# Patient Record
Sex: Female | Born: 1965 | Race: Black or African American | Hispanic: No | Marital: Single | Smoking: Never smoker
Health system: Southern US, Community
[De-identification: ages and names within clinical notes are randomized; demographics above are authoritative.]

## PROBLEM LIST (undated history)

## (undated) DIAGNOSIS — D649 Anemia, unspecified: Secondary | ICD-10-CM

## (undated) HISTORY — PX: DILATION AND CURETTAGE OF UTERUS: SHX78

## (undated) HISTORY — PX: OTHER SURGICAL HISTORY: SHX169

## (undated) HISTORY — DX: Anemia, unspecified: D64.9

---
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
## 1988-09-23 HISTORY — PX: BREAST CYST ASPIRATION: SHX578

## 2005-04-30 ENCOUNTER — Ambulatory Visit: Payer: Self-pay

## 2005-05-13 ENCOUNTER — Ambulatory Visit: Payer: Self-pay | Admitting: Family Medicine

## 2005-08-01 ENCOUNTER — Encounter: Admission: RE | Admit: 2005-08-01 | Discharge: 2005-08-01 | Payer: Self-pay | Admitting: Obstetrics and Gynecology

## 2005-09-19 ENCOUNTER — Ambulatory Visit: Payer: Self-pay

## 2009-06-26 ENCOUNTER — Ambulatory Visit: Payer: Self-pay | Admitting: Obstetrics and Gynecology

## 2009-09-23 HISTORY — PX: FOOT SURGERY: SHX648

## 2010-02-02 ENCOUNTER — Ambulatory Visit: Payer: Self-pay | Admitting: Gastroenterology

## 2010-03-08 ENCOUNTER — Ambulatory Visit: Payer: Self-pay | Admitting: Family Medicine

## 2010-08-14 ENCOUNTER — Ambulatory Visit: Payer: Self-pay | Admitting: Obstetrics and Gynecology

## 2011-11-14 ENCOUNTER — Ambulatory Visit: Payer: Self-pay | Admitting: Obstetrics and Gynecology

## 2013-01-26 ENCOUNTER — Ambulatory Visit: Payer: Self-pay | Admitting: Obstetrics and Gynecology

## 2014-03-22 ENCOUNTER — Ambulatory Visit: Payer: Self-pay | Admitting: Obstetrics and Gynecology

## 2014-07-25 ENCOUNTER — Ambulatory Visit: Payer: Self-pay | Admitting: Family Medicine

## 2014-09-27 ENCOUNTER — Ambulatory Visit: Payer: Self-pay | Admitting: Internal Medicine

## 2014-10-24 ENCOUNTER — Ambulatory Visit: Payer: Self-pay | Admitting: Internal Medicine

## 2014-12-07 ENCOUNTER — Ambulatory Visit: Payer: Self-pay | Admitting: Family Medicine

## 2015-02-03 ENCOUNTER — Ambulatory Visit
Admission: RE | Admit: 2015-02-03 | Discharge: 2015-02-03 | Disposition: A | Discharge status: Home / Self Care | Payer: 59 | Source: Ambulatory Visit | Attending: Family Medicine | Admitting: Family Medicine

## 2015-02-03 ENCOUNTER — Other Ambulatory Visit: Payer: Self-pay | Admitting: Family Medicine

## 2015-02-03 DIAGNOSIS — M545 Low back pain: Secondary | ICD-10-CM | POA: Insufficient documentation

## 2015-04-28 ENCOUNTER — Other Ambulatory Visit: Payer: Self-pay | Admitting: Obstetrics and Gynecology

## 2015-04-28 DIAGNOSIS — Z1231 Encounter for screening mammogram for malignant neoplasm of breast: Secondary | ICD-10-CM

## 2015-05-02 ENCOUNTER — Encounter (HOSPITAL_COMMUNITY): Payer: Self-pay | Admitting: *Deleted

## 2015-05-04 ENCOUNTER — Ambulatory Visit
Admission: RE | Admit: 2015-05-04 | Discharge: 2015-05-04 | Disposition: A | Discharge status: Home / Self Care | Payer: 59 | Source: Ambulatory Visit | Attending: Obstetrics and Gynecology | Admitting: Obstetrics and Gynecology

## 2015-05-04 DIAGNOSIS — Z1231 Encounter for screening mammogram for malignant neoplasm of breast: Secondary | ICD-10-CM | POA: Diagnosis not present

## 2015-10-24 ENCOUNTER — Ambulatory Visit: Payer: Self-pay | Admitting: Family Medicine

## 2015-10-26 ENCOUNTER — Encounter: Payer: Self-pay | Admitting: Family Medicine

## 2015-10-26 ENCOUNTER — Ambulatory Visit (INDEPENDENT_AMBULATORY_CARE_PROVIDER_SITE_OTHER): Payer: 59 | Admitting: Family Medicine

## 2015-10-26 VITALS — BP 108/63 | HR 76 | Temp 98.1°F | Resp 18 | Ht 62.0 in | Wt 199.7 lb

## 2015-10-26 DIAGNOSIS — M25571 Pain in right ankle and joints of right foot: Secondary | ICD-10-CM | POA: Diagnosis not present

## 2015-10-26 DIAGNOSIS — R42 Dizziness and giddiness: Secondary | ICD-10-CM | POA: Diagnosis not present

## 2015-10-26 NOTE — Progress Notes (Signed)
Name: Kristine Mcconnell   MRN: [id number]    DOB: September 05, 1966   Date:10/26/2015       Progress Note  Subjective  Chief Complaint  Chief Complaint  Patient presents with  . Acute Visit    Light Headed    Dizziness This is a new problem. Episode onset: 1 month ago. Pertinent negatives include no abdominal pain, chest pain, chills, coughing, fever, headaches, neck pain, sore throat, vertigo or visual change. She has tried nothing for the symptoms.  reports no vertigo but feels as if she is lightheaded, has not lost her balance, has happened intermittently over the past 4 weeks.  Patient also concerned about pain in her right ankle,history of trauma.  Past Medical History  Diagnosis Date  . Anemia     Past Surgical History  Procedure Laterality Date  . Breast cyst aspiration Left 1990    benign  . Foot surgery Right 2011  . Tonsillectomy  1977    Family History  Problem Relation Age of Onset  . Diabetes Mother   . Hypertension Mother   . Hypertension Father     Social History   Social History  . Marital Status: Single    Spouse Name: N/A  . Number of Children: N/A  . Years of Education: N/A   Occupational History  . Not on file.   Social History Main Topics  . Smoking status: Never Smoker   . Smokeless tobacco: Never Used  . Alcohol Use: No  . Drug Use: No  . Sexual Activity:    Partners: Male   Other Topics Concern  . Not on file   Social History Narrative    No current outpatient prescriptions on file.  No Known Allergies   Review of Systems  Constitutional: Positive for malaise/fatigue. Negative for fever and chills.  HENT: Negative for ear pain and sore throat.   Eyes: Negative for blurred vision and double vision.  Respiratory: Negative for cough and shortness of breath.   Cardiovascular: Negative for chest pain and palpitations.  Gastrointestinal: Negative for abdominal pain, blood in stool and melena.  Genitourinary: Negative for dysuria and  hematuria.  Musculoskeletal: Negative for back pain and neck pain.  Neurological: Positive for dizziness. Negative for vertigo and headaches.  Psychiatric/Behavioral: Negative for depression. The patient is not nervous/anxious and does not have insomnia.     Objective  Filed Vitals:   10/26/15 1151  BP: 108/63  Pulse: 76  Temp: 98.1 F (36.7 C)  TempSrc: Oral  Resp: 18  Height: 5\' 2"  (1.575 m)  Weight: 199 lb 11.2 oz (90.583 kg)  SpO2: 99%    Physical Exam  Constitutional: She is oriented to person, place, and time and well-developed, well-nourished, and in no distress.  HENT:  Head: Normocephalic and atraumatic.  Eyes: Pupils are equal, round, and reactive to light.  Neck: Normal range of motion. Neck supple.  Cardiovascular: Normal rate and regular rhythm.   No murmur heard. Pulmonary/Chest: Effort normal and breath sounds normal. She has no wheezes.  Abdominal: Soft. Bowel sounds are normal. There is no tenderness.  Musculoskeletal: She exhibits no edema.       Right ankle: She exhibits normal range of motion and no swelling. Tenderness.       Feet:  Mild tenderness to palpation over the right postero-lateral ankle joint.  Neurological: She is alert and oriented to person, place, and time. She has normal sensation. No cranial nerve deficit. Gait normal.  Skin: Skin is warm  and dry.  Nursing note and vitals reviewed.     Assessment & Plan  1. Light headedness Unclear etiology. Cardiovascular and neurological exam is normal. We'll start with laboratory evaluation. If lab work is normal but symptoms persist, we'll proceed to the imaging stage. Patient verbalized agreement with plan. - CBC with Differential - Comprehensive Metabolic Panel (CMET) - TSH  2. Right ankle pain  - DG Ankle Complete Right; Future  Bari Handshoe Asad A[facility] 10/26/2015 12:22 PM

## 2015-10-27 LAB — CBC WITH DIFFERENTIAL/PLATELET
BASOS: 1 %
Basophils Absolute: 0 10*3/uL (ref 0.0–0.2)
EOS (ABSOLUTE): 0.1 10*3/uL (ref 0.0–0.4)
Eos: 3 %
HEMOGLOBIN: 11.1 g/dL (ref 11.1–15.9)
Hematocrit: 36.1 % (ref 34.0–46.6)
Immature Grans (Abs): 0 10*3/uL (ref 0.0–0.1)
Immature Granulocytes: 0 %
Lymphocytes Absolute: 1.7 10*3/uL (ref 0.7–3.1)
Lymphs: 38 %
MCH: 26.2 pg — AB (ref 26.6–33.0)
MCHC: 30.7 g/dL — ABNORMAL LOW (ref 31.5–35.7)
MCV: 85 fL (ref 79–97)
Monocytes Absolute: 0.5 10*3/uL (ref 0.1–0.9)
Monocytes: 12 %
NEUTROS ABS: 2 10*3/uL (ref 1.4–7.0)
Neutrophils: 46 %
Platelets: 338 10*3/uL (ref 150–379)
RBC: 4.23 x10E6/uL (ref 3.77–5.28)
RDW: 15 % (ref 12.3–15.4)
WBC: 4.4 10*3/uL (ref 3.4–10.8)

## 2015-10-27 LAB — COMPREHENSIVE METABOLIC PANEL
ALBUMIN: 4.1 g/dL (ref 3.5–5.5)
ALT: 10 IU/L (ref 0–32)
AST: 9 IU/L (ref 0–40)
Albumin/Globulin Ratio: 1.6 (ref 1.1–2.5)
Alkaline Phosphatase: 76 IU/L (ref 39–117)
BUN/Creatinine Ratio: 14 (ref 9–23)
BUN: 11 mg/dL (ref 6–24)
Bilirubin Total: 0.7 mg/dL (ref 0.0–1.2)
CO2: 22 mmol/L (ref 18–29)
Calcium: 9.3 mg/dL (ref 8.7–10.2)
Chloride: 104 mmol/L (ref 96–106)
Creatinine, Ser: 0.76 mg/dL (ref 0.57–1.00)
GFR calc Af Amer: 108 mL/min/{1.73_m2} (ref >59)
GFR calc non Af Amer: 94 mL/min/{1.73_m2} (ref >59)
GLUCOSE: 90 mg/dL (ref 65–99)
Globulin, Total: 2.6 g/dL (ref 1.5–4.5)
Potassium: 5.5 mmol/L — ABNORMAL HIGH (ref 3.5–5.2)
SODIUM: 141 mmol/L (ref 134–144)
Total Protein: 6.7 g/dL (ref 6.0–8.5)

## 2015-10-27 LAB — TSH: TSH: 2.77 u[IU]/mL (ref 0.450–4.500)

## 2015-11-09 ENCOUNTER — Ambulatory Visit: Payer: 59 | Admitting: Family Medicine

## 2016-02-27 ENCOUNTER — Other Ambulatory Visit: Payer: Self-pay | Admitting: Obstetrics and Gynecology

## 2016-02-27 DIAGNOSIS — Z1231 Encounter for screening mammogram for malignant neoplasm of breast: Secondary | ICD-10-CM

## 2016-05-06 ENCOUNTER — Other Ambulatory Visit: Payer: Self-pay | Admitting: Obstetrics and Gynecology

## 2016-05-06 ENCOUNTER — Ambulatory Visit
Admission: RE | Admit: 2016-05-06 | Discharge: 2016-05-06 | Disposition: A | Discharge status: Home / Self Care | Payer: 59 | Source: Ambulatory Visit | Attending: Obstetrics and Gynecology | Admitting: Obstetrics and Gynecology

## 2016-05-06 DIAGNOSIS — Z1231 Encounter for screening mammogram for malignant neoplasm of breast: Secondary | ICD-10-CM | POA: Diagnosis present

## 2016-06-10 ENCOUNTER — Ambulatory Visit (INDEPENDENT_AMBULATORY_CARE_PROVIDER_SITE_OTHER): Payer: 59 | Admitting: Family Medicine

## 2016-06-10 ENCOUNTER — Encounter: Payer: Self-pay | Admitting: Family Medicine

## 2016-06-10 DIAGNOSIS — E669 Obesity, unspecified: Secondary | ICD-10-CM

## 2016-06-10 NOTE — Progress Notes (Signed)
Name: Kristine Mcconnell   MRN: [id number]    DOB: 03-29-1966   Date:06/10/2016       Progress Note  Subjective  Chief Complaint  Chief Complaint  Patient presents with  . Weight Loss    HPI  Pt. Presents to discuss an appeal for [employer] for BMI. Her BMI is 34.35kg/m2, considered obese. Pt. Reports she is on no diet plan, breakfast usually consist of a bowl of cereal with cookies sometimes, lunch is usually fast food ('whatever I can grab'), and dinner is also usually fast food (or leftovers from lunch).  She is physically active, working out 3-5x/week, does boot camp, High-intensity interval training, and dance.    Past Medical History:  Diagnosis Date  . Anemia     Past Surgical History:  Procedure Laterality Date  . BREAST CYST ASPIRATION Left 1990   benign  . FOOT SURGERY Right 2011  . TONSILLECTOMY  1977    Family History  Problem Relation Age of Onset  . Diabetes Mother   . Hypertension Mother   . Hypertension Father     Social History   Social History  . Marital status: Single    Spouse name: N/A  . Number of children: N/A  . Years of education: N/A   Occupational History  . Not on file.   Social History Main Topics  . Smoking status: Never Smoker  . Smokeless tobacco: Never Used  . Alcohol use No  . Drug use: No  . Sexual activity: Yes    Partners: Male   Other Topics Concern  . Not on file   Social History Narrative  . No narrative on file    No current outpatient prescriptions on file.  No Known Allergies   Review of Systems  Constitutional: Negative for chills, fever and malaise/fatigue.  Eyes: Negative for blurred vision and double vision.  Cardiovascular: Negative for chest pain.  Gastrointestinal: Negative for abdominal pain.  Neurological: Negative for headaches.    Objective  Vitals:   06/10/16 1603  BP: 110/77  Pulse: 67  Resp: 15  Temp: 98.2 F (36.8 C)  TempSrc: Oral  SpO2: 93%  Weight: 208 lb (94.3 kg)  Height: 5'  5.25" (1.657 m)    Physical Exam  Constitutional: She is oriented to person, place, and time and well-developed, well-nourished, and in no distress.  HENT:  Head: Normocephalic.  Cardiovascular: Normal rate, regular rhythm, S1 normal and S2 normal.   Murmur heard.  Systolic murmur is present with a grade of 2/6  Pulmonary/Chest: Effort normal and breath sounds normal. She has no wheezes.  Abdominal: Soft. There is no tenderness.  Neurological: She is alert and oriented to person, place, and time.  Psychiatric: Mood, memory, affect and judgment normal.  Nursing note and vitals reviewed.       Assessment & Plan  1. Obesity (BMI 35.0-39.9 without comorbidity) ([place]) Encouraged to avoid fast foods, substitute fruits and vegetables, avoid fast foods and try to cook at home. continue on exercise regimen. Follow-up in 3 months [facility] [employer]. paperwork completed   [employer] [facility] 06/10/2016 4:23 PM

## 2016-09-09 ENCOUNTER — Ambulatory Visit: Payer: 59 | Admitting: Family Medicine

## 2017-03-07 ENCOUNTER — Other Ambulatory Visit: Payer: Self-pay | Admitting: Obstetrics and Gynecology

## 2017-03-07 DIAGNOSIS — Z1231 Encounter for screening mammogram for malignant neoplasm of breast: Secondary | ICD-10-CM

## 2017-05-08 ENCOUNTER — Ambulatory Visit
Admission: RE | Admit: 2017-05-08 | Discharge: 2017-05-08 | Disposition: A | Discharge status: Home / Self Care | Payer: 59 | Source: Ambulatory Visit | Attending: Obstetrics and Gynecology | Admitting: Obstetrics and Gynecology

## 2017-05-08 DIAGNOSIS — Z1231 Encounter for screening mammogram for malignant neoplasm of breast: Secondary | ICD-10-CM | POA: Diagnosis not present

## 2017-06-04 NOTE — H&P (Addendum)
Kristine Mcconnell is a 51 y.o. female here myosure resection and Novasure ablation . Pt here for f/up for heavy bleeding .  Pt with anemia on screening CBC . States that she bleeds heavily for 7 days + clots . MCV =69 No pelvic pain , no dyspareunia  Recent pap wnl  Embx 04/10/17: neg   Past Medical History:  has a past medical history of Hypertension and LGSIL (low grade squamous intraepithelial dysplasia).  Past Surgical History:  has a past surgical history that includes Tonsillectomy; Laparoscopic Gastroplasty Non-Vertical Banding For Obesity (2007); other surgery (Right); and Colposcopy (04-24-2012). Family History: family history includes Bone cancer in her paternal grandfather; Colon cancer in her paternal grandmother; Diabetes in her mother. Social History:  reports that she has never smoked. She has never used smokeless tobacco. She reports that she drinks alcohol. She reports that she does not use drugs. OB/GYN History:          OB History    Gravida Para Term Preterm AB Living   1 1 1     1    SAB TAB Ectopic Molar Multiple Live Births             1      Allergies: has No Known Allergies. Medications:  Current Outpatient Prescriptions:  .  phentermine (ADIPEX-P) 37.5 MG capsule, Take 1 capsule (37.5 mg total) by mouth every morning before breakfast. (Patient not taking: Reported on 05/06/2017 ), Disp: 30 capsule, Rfl: 0  Review of Systems: General:                      No fatigue or weight loss Eyes:                           No vision changes Ears:                            No hearing difficulty Respiratory:                No cough or shortness of breath Pulmonary:                  No asthma or shortness of breath Cardiovascular:           No chest pain, palpitations, dyspnea on exertion Gastrointestinal:          No abdominal bloating, chronic diarrhea, constipations, masses, pain or hematochezia Genitourinary:             No hematuria, dysuria, abnormal vaginal  discharge, pelvic pain, Menometrorrhagia Lymphatic:                   No swollen lymph nodes Musculoskeletal:         No muscle weakness Neurologic:                  No extremity weakness, syncope, seizure disorder Psychiatric:                  No history of depression, delusions or suicidal/homicidal ideation    Exam:      Vitals:   06/04/17 1359  BP: (!) 143/93  Pulse: 82    Body mass index is 35.45 kg/m.  WDWN  black female in NAD   Lungs: CTA  CV : RRR without murmur    Neck:  no thyromegaly Abdomen: soft , no mass, normal active bowel  sounds,  non-tender, no rebound tenderness Pelvic: tanner stage 5 ,  External genitalia: vulva /labia no lesions Urethra: no prolapse Vagina: normal physiologic d/c, limited room for possible TVH  Cervix: no lesions, no cervical motion tenderness   Uterus: normal size shape and contour, non-tender Adnexa: no mass,  non-tender   Rectovaginal:  Saline infusion sonohysterography: betadine prep to the cervix followed by placement of the HSG catheter into the endometrial canal . Sterile H2O is injected while performing a transvaginal u/s . Findings:3.2x2.7x2.8 cm endometrial fibroid / polyp  Several other intramural ( 3 ) largest 32 mm  Impression:   The primary encounter diagnosis was Menorrhagia with regular cycle. Diagnoses of Microcytic anemia and Endometrial polyp were also pertinent to this visit.  Possible endometrial fibroid   Plan:   I have spoken with the patient regarding treatment options including expectant management, hormonal options, or surgical intervention. After a full discussion the pt elects to proceed with Myosure resection and Novasure endometrial ablation  Benefits and risks to surgery: The proposed benefit of the surgery has been discussed with the patient. The possible risks include, but are not limited to: organ injury to the bowel , bladder, ureters, and major blood vessels and nerves. There is a  possibility of additional surgeries resulting from these injuries. There is also the risk of blood transfusion and the need to receive blood products during or after the procedure which may rarely lead to HIV or Hepatitis C infection. There is a risk of developing a deep venous thrombosis or a pulmonary embolism . There is the possibility of wound infection and also anesthetic complications, even the rare possibility of death. The patient understands these risks and wishes to proceed. All questions have been answered and the consent has been signed.    Return if symptoms worsen or fail to improve, for preop.  Caroline Sauger, MD

## 2017-06-05 ENCOUNTER — Encounter
Admission: RE | Admit: 2017-06-05 | Discharge: 2017-06-05 | Disposition: A | Discharge status: Home / Self Care | Payer: 59 | Source: Ambulatory Visit | Attending: Obstetrics and Gynecology | Admitting: Obstetrics and Gynecology

## 2017-06-05 NOTE — Patient Instructions (Signed)
Your procedure is scheduled on: 06-09-17 Report to Same Day Surgery 2nd floor medical mall [facility] Entrance-take elevator on left to 2nd floor.  Check in with surgery information desk.) To find out your arrival time please call [phone] between 1PM - 3PM on 06-06-17  Remember: Instructions that are not followed completely may result in serious medical risk, up to and including death, or upon the discretion of your surgeon and anesthesiologist your surgery may need to be rescheduled.    _x___ 1. Do not eat food after midnight the night before your procedure. You may drink clear liquids up to 2 hours before you are scheduled to arrive at the hospital for your procedure.  Do not drink clear liquids within 2 hours of your scheduled arrival to the hospital.  Clear liquids include  --Water or Apple juice without pulp  --Clear carbohydrate beverage such as ClearFast or Gatorade  --Black Coffee or Clear Tea (No milk, no creamers, do not add anything to  the coffee or Tea Type 1 and type 2 diabetics should only drink water.  No gum chewing or hard candies.     __x__ 2. No Alcohol for 24 hours before or after surgery.   __x__3. No Smoking for 24 prior to surgery.   ____  4. Bring all medications with you on the day of surgery if instructed.    __x__ 5. Notify your doctor if there is any change in your medical condition     (cold, fever, infections).     Do not wear jewelry, make-up, hairpins, clips or nail polish.  Do not wear lotions, powders, or perfumes. You may wear deodorant.  Do not shave 48 hours prior to surgery. Men may shave face and neck.  Do not bring valuables to the hospital.    [facility] is not responsible for any belongings or valuables.               Contacts, dentures or bridgework may not be worn into surgery.  Leave your suitcase in the car. After surgery it may be brought to your room.  For patients admitted to the hospital, discharge time is determined by  your                       treatment team.   Patients discharged the day of surgery will not be allowed to drive home.  You will need someone to drive you home and stay with you the night of your procedure.    Please read over the following fact sheets that you were given:   [facility] Preparing for Surgery and or MRSA Information   ____ Take anti-hypertensive listed below, cardiac, seizure, asthma,     anti-reflux and psychiatric medicines. These include:  1. NONE  2.  3.  4.  5.  6.  ____Fleets enema or Magnesium Citrate as directed.   ____ Use CHG Soap or sage wipes as directed on instruction sheet   ____ Use inhalers on the day of surgery and bring to hospital day of surgery  ____ Stop Metformin and Janumet 2 days prior to surgery.    ____ Take 1/2 of usual insulin dose the night before surgery and none on the morning     surgery.   ____ Follow recommendations from Cardiologist, Pulmonologist or PCP regarding stopping Aspirin, Coumadin, Plavix ,Eliquis, Effient, or Pradaxa, and Pletal.  X____Stop Anti-inflammatories such as Advil, Aleve, Ibuprofen, Motrin, Naproxen, Naprosyn, Goodies powders or aspirin products  NOW-OK to take Tylenol    _x___ Stop supplements until after surgery-PT STOPPED PHENTERMINE 2 WEEKS AGO   ____ Bring C-Pap to the hospital.

## 2017-06-08 MED ORDER — CEFOXITIN SODIUM-DEXTROSE 2-2.2 GM-% IV SOLR (PREMIX)
2.0000 g | INTRAVENOUS | Status: AC
  Administered 2017-06-09: 2 g via INTRAVENOUS

## 2017-06-09 ENCOUNTER — Ambulatory Visit: Payer: 59 | Admitting: Certified Registered Nurse Anesthetist

## 2017-06-09 ENCOUNTER — Ambulatory Visit
Admission: RE | Admit: 2017-06-09 | Discharge: 2017-06-09 | Disposition: A | Discharge status: Home / Self Care | Payer: 59 | Source: Ambulatory Visit | Attending: Obstetrics and Gynecology | Admitting: Obstetrics and Gynecology

## 2017-06-09 ENCOUNTER — Encounter: Payer: Self-pay | Admitting: *Deleted

## 2017-06-09 ENCOUNTER — Encounter
Admission: RE | Disposition: A | Discharge status: Home / Self Care | Payer: Self-pay | Source: Ambulatory Visit | Attending: Obstetrics and Gynecology

## 2017-06-09 DIAGNOSIS — N840 Polyp of corpus uteri: Secondary | ICD-10-CM | POA: Insufficient documentation

## 2017-06-09 DIAGNOSIS — Z800 Family history of malignant neoplasm of digestive organs: Secondary | ICD-10-CM | POA: Diagnosis not present

## 2017-06-09 DIAGNOSIS — Z808 Family history of malignant neoplasm of other organs or systems: Secondary | ICD-10-CM | POA: Insufficient documentation

## 2017-06-09 DIAGNOSIS — D509 Iron deficiency anemia, unspecified: Secondary | ICD-10-CM | POA: Insufficient documentation

## 2017-06-09 DIAGNOSIS — N800 Endometriosis of uterus: Secondary | ICD-10-CM | POA: Insufficient documentation

## 2017-06-09 DIAGNOSIS — I10 Essential (primary) hypertension: Secondary | ICD-10-CM | POA: Insufficient documentation

## 2017-06-09 DIAGNOSIS — Z833 Family history of diabetes mellitus: Secondary | ICD-10-CM | POA: Insufficient documentation

## 2017-06-09 DIAGNOSIS — N920 Excessive and frequent menstruation with regular cycle: Secondary | ICD-10-CM | POA: Diagnosis present

## 2017-06-09 DIAGNOSIS — D250 Submucous leiomyoma of uterus: Secondary | ICD-10-CM | POA: Diagnosis not present

## 2017-06-09 HISTORY — PX: DILATATION & CURETTAGE/HYSTEROSCOPY WITH MYOSURE: SHX6511

## 2017-06-09 HISTORY — PX: HYSTEROSCOPY WITH NOVASURE: SHX5574

## 2017-06-09 LAB — TYPE AND SCREEN
ABO/RH(D): O POS
ANTIBODY SCREEN: NEGATIVE

## 2017-06-09 LAB — POCT PREGNANCY, URINE: PREG TEST UR: NEGATIVE

## 2017-06-09 SURGERY — DILATATION & CURETTAGE/HYSTEROSCOPY WITH MYOSURE
Anesthesia: General | Wound class: Clean Contaminated

## 2017-06-09 MED ORDER — FENTANYL CITRATE (PF) 100 MCG/2ML IJ SOLN
INTRAMUSCULAR | Status: AC
  Administered 2017-06-09: 25 ug via INTRAVENOUS
  Filled 2017-06-09: qty 2

## 2017-06-09 MED ORDER — FAMOTIDINE 20 MG PO TABS
20.0000 mg | ORAL_TABLET | Freq: Once | ORAL | Status: AC
  Administered 2017-06-09: 20 mg via ORAL

## 2017-06-09 MED ORDER — ONDANSETRON HCL 4 MG/2ML IJ SOLN
INTRAMUSCULAR | Status: AC
  Filled 2017-06-09: qty 2

## 2017-06-09 MED ORDER — ONDANSETRON HCL 4 MG/2ML IJ SOLN: 4.0000 mg | Freq: Once | INTRAMUSCULAR | Status: DC | PRN

## 2017-06-09 MED ORDER — SILVER NITRATE-POT NITRATE 75-25 % EX MISC
CUTANEOUS | Status: DC | PRN
  Administered 2017-06-09: 2 via TOPICAL

## 2017-06-09 MED ORDER — FENTANYL CITRATE (PF) 100 MCG/2ML IJ SOLN
25.0000 ug | INTRAMUSCULAR | Status: DC | PRN
  Administered 2017-06-09: 25 ug via INTRAVENOUS

## 2017-06-09 MED ORDER — ACETAMINOPHEN 10 MG/ML IV SOLN
INTRAVENOUS | Status: DC | PRN
  Administered 2017-06-09: 1000 mg via INTRAVENOUS

## 2017-06-09 MED ORDER — FAMOTIDINE 20 MG PO TABS
ORAL_TABLET | ORAL | Status: AC
  Administered 2017-06-09: 20 mg via ORAL
  Filled 2017-06-09: qty 1

## 2017-06-09 MED ORDER — MIDAZOLAM HCL 2 MG/2ML IJ SOLN
INTRAMUSCULAR | Status: AC
  Filled 2017-06-09: qty 2

## 2017-06-09 MED ORDER — FENTANYL CITRATE (PF) 100 MCG/2ML IJ SOLN
INTRAMUSCULAR | Status: AC
  Filled 2017-06-09: qty 2

## 2017-06-09 MED ORDER — ONDANSETRON HCL 4 MG/2ML IJ SOLN
INTRAMUSCULAR | Status: DC | PRN
  Administered 2017-06-09: 4 mg via INTRAVENOUS

## 2017-06-09 MED ORDER — LIDOCAINE HCL (CARDIAC) 20 MG/ML IV SOLN
INTRAVENOUS | Status: DC | PRN
  Administered 2017-06-09: 50 mg via INTRAVENOUS

## 2017-06-09 MED ORDER — LACTATED RINGERS IV SOLN
INTRAVENOUS | Status: DC
  Administered 2017-06-09: 10:00:00 via INTRAVENOUS

## 2017-06-09 MED ORDER — ACETAMINOPHEN 10 MG/ML IV SOLN
INTRAVENOUS | Status: AC
  Filled 2017-06-09: qty 100

## 2017-06-09 MED ORDER — DEXAMETHASONE SODIUM PHOSPHATE 10 MG/ML IJ SOLN
INTRAMUSCULAR | Status: DC | PRN
Start: 2017-06-09 — End: 2017-06-09
  Administered 2017-06-09: 8 mg via INTRAVENOUS

## 2017-06-09 MED ORDER — FENTANYL CITRATE (PF) 100 MCG/2ML IJ SOLN
INTRAMUSCULAR | Status: DC | PRN
  Administered 2017-06-09 (×3): 50 ug via INTRAVENOUS

## 2017-06-09 MED ORDER — LACTATED RINGERS IV SOLN: INTRAVENOUS | Status: DC

## 2017-06-09 MED ORDER — MIDAZOLAM HCL 2 MG/2ML IJ SOLN
INTRAMUSCULAR | Status: DC | PRN
  Administered 2017-06-09: 2 mg via INTRAVENOUS

## 2017-06-09 MED ORDER — PROPOFOL 10 MG/ML IV BOLUS
INTRAVENOUS | Status: DC | PRN
Start: 2017-06-09 — End: 2017-06-09
  Administered 2017-06-09: 200 mg via INTRAVENOUS

## 2017-06-09 MED ORDER — PROPOFOL 10 MG/ML IV BOLUS
INTRAVENOUS | Status: AC
  Filled 2017-06-09: qty 20

## 2017-06-09 MED ORDER — DEXAMETHASONE SODIUM PHOSPHATE 10 MG/ML IJ SOLN
INTRAMUSCULAR | Status: AC
  Filled 2017-06-09: qty 1

## 2017-06-09 MED ORDER — KETOROLAC TROMETHAMINE 30 MG/ML IJ SOLN
INTRAMUSCULAR | Status: DC | PRN
  Administered 2017-06-09: 30 mg via INTRAVENOUS

## 2017-06-09 MED ORDER — CEFOXITIN SODIUM-DEXTROSE 2-2.2 GM-% IV SOLR (PREMIX)
INTRAVENOUS | Status: AC
  Filled 2017-06-09: qty 50

## 2017-06-09 SURGICAL SUPPLY — 25 items
ABLATOR ENDOMETRIAL MYOSURE (ABLATOR) IMPLANT
CANISTER SUC SOCK COL 7IN (MISCELLANEOUS) ×3 IMPLANT
CANISTER SUCT 1200ML W/VALVE (MISCELLANEOUS) ×3 IMPLANT
CANISTER SUCT 3000ML PPV (MISCELLANEOUS) ×3 IMPLANT
CATH ROBINSON RED A/P 16FR (CATHETERS) ×3 IMPLANT
DEVICE MYOSURE LITE (MISCELLANEOUS) IMPLANT
GLOVE BIO SURGEON STRL SZ8 (GLOVE) ×3 IMPLANT
GOWN STRL REUS W/ TWL LRG LVL3 (GOWN DISPOSABLE) ×1 IMPLANT
GOWN STRL REUS W/ TWL XL LVL3 (GOWN DISPOSABLE) ×1 IMPLANT
GOWN STRL REUS W/TWL LRG LVL3 (GOWN DISPOSABLE) ×3
GOWN STRL REUS W/TWL XL LVL3 (GOWN DISPOSABLE) ×3
IV LACTATED RINGERS 1000ML (IV SOLUTION) ×3 IMPLANT
KIT RM TURNOVER CYSTO AR (KITS) ×3 IMPLANT
NOVASURE ENDOMETRIAL ABLATION (MISCELLANEOUS) ×3 IMPLANT
NS IRRIG 500ML POUR BTL (IV SOLUTION) ×3 IMPLANT
PACK DNC HYST (MISCELLANEOUS) ×3 IMPLANT
PAD OB MATERNITY 4.3X12.25 (PERSONAL CARE ITEMS) ×3 IMPLANT
PAD PREP 24X41 OB/GYN DISP (PERSONAL CARE ITEMS) ×3 IMPLANT
SOL .9 NS 3000ML IRR  AL (IV SOLUTION) ×2
SOL .9 NS 3000ML IRR AL (IV SOLUTION) ×1
SOL .9 NS 3000ML IRR UROMATIC (IV SOLUTION) ×1 IMPLANT
TOWEL OR 17X26 4PK STRL BLUE (TOWEL DISPOSABLE) ×3 IMPLANT
TUBING CONNECTING 10 (TUBING) ×2 IMPLANT
TUBING CONNECTING 10' (TUBING) ×1
TUBING HYSTEROSCOPY DOLPHIN (MISCELLANEOUS) ×3 IMPLANT

## 2017-06-09 NOTE — Anesthesia Procedure Notes (Signed)
Procedure Name: LMA Insertion Date/Time: 06/09/2017 10:13 AM Performed by: Darlyne Russian Pre-anesthesia Checklist: Patient identified, Emergency Drugs available, Suction available, Patient being monitored and Timeout performed Patient Re-evaluated:Patient Re-evaluated prior to induction Oxygen Delivery Method: Circle system utilized Preoxygenation: Pre-oxygenation with 100% oxygen Induction Type: IV induction Ventilation: Mask ventilation without difficulty LMA: LMA inserted LMA Size: 4.0 Placement Confirmation: positive ETCO2 Tube secured with: Tape Dental Injury: Teeth and Oropharynx as per pre-operative assessment

## 2017-06-09 NOTE — Progress Notes (Signed)
minmal drainage on peri pad hen taken to bathroom

## 2017-06-09 NOTE — Brief Op Note (Signed)
06/09/2017  11:10 AM  PATIENT:  Kristine Mcconnell  51 y.o. female  PRE-OPERATIVE DIAGNOSIS:  Endometrial Polyp  Menorrhagia  POST-OPERATIVE DIAGNOSIS:  Menorrhagia , submucosal fibroid  PROCEDURE:   ECC  Submucosal resection of fibroid with myosure  Novasure endometrial ablation  SURGEON:  Surgeon(s) and Role:    * Shaunice Levitan, Gwen Her, MD - Primary  PHYSICIAN ASSISTANT: Doman , scrub tech   ASSISTANTS: none none  ANESTHESIA:   general  EBL:  Total I/O In: -  Out: 150 [Urine:150]  BLOOD ADMINISTERED:none  DRAINS: none   LOCAL MEDICATIONS USED:  NONE  SPECIMEN:  Source of Specimen:  ecc , submucosal fibroid resection   DISPOSITION OF SPECIMEN:  PATHOLOGY  COUNTS:  YES  TOURNIQUET:  * No tourniquets in log *  DICTATION: .Other Dictation: Dictation Number verbal  PLAN OF CARE: Discharge to home after PACU  PATIENT DISPOSITION:  PACU - hemodynamically stable.   Delay start of Pharmacological VTE agent (>24hrs) due to surgical blood loss or risk of bleeding: not applicable

## 2017-06-09 NOTE — Anesthesia Post-op Follow-up Note (Signed)
Anesthesia QCDR form completed.        

## 2017-06-09 NOTE — Anesthesia Postprocedure Evaluation (Signed)
Anesthesia Post Note  Patient: Kristine Mcconnell  Procedure(s) Performed: Procedure(s) (LRB): DILATATION & CURETTAGE/HYSTEROSCOPY WITH MYOSURE RESECTION OF FIBROID (N/A) HYSTEROSCOPY WITH NOVASURE (N/A)  Patient location during evaluation: PACU Anesthesia Type: General Level of consciousness: awake and alert Pain management: pain level controlled Vital Signs Assessment: post-procedure vital signs reviewed and stable Respiratory status: spontaneous breathing and respiratory function stable Cardiovascular status: stable Anesthetic complications: no     Last Vitals:  Vitals:   06/09/17 1122 06/09/17 1139  BP: (!) 144/78 (!) 160/89  Pulse: 85 68  Resp: 15 16  Temp: 36.6 C   SpO2: 100% 100%    Last Pain:  Vitals:   06/09/17 1139  TempSrc:   PainSc: 0-No pain                 Lennex Pietila K

## 2017-06-09 NOTE — Progress Notes (Signed)
Pt here for Myosure resection of endometrial polyp and novasure ablation  Labs reviewed . NPO  Neg HCG  Proceed

## 2017-06-09 NOTE — Transfer of Care (Signed)
Immediate Anesthesia Transfer of Care Note  Patient: Kristine Mcconnell  Procedure(s) Performed: Procedure(s): DILATATION & CURETTAGE/HYSTEROSCOPY WITH MYOSURE RESECTION OF FIBROID (N/A) HYSTEROSCOPY WITH NOVASURE (N/A)  Patient Location: PACU  Anesthesia Type:General  Level of Consciousness: drowsy and patient cooperative  Airway & Oxygen Therapy: Patient Spontanous Breathing and Patient connected to nasal cannula oxygen  Post-op Assessment: Report given to RN and Post -op Vital signs reviewed and stable  Post vital signs: Reviewed and stable  Last Vitals:  Vitals:   06/09/17 0900 06/09/17 1122  BP:  (!) 144/78  Pulse:  85  Resp:  15  Temp: 36.6 C 36.6 C  SpO2:  100%    Last Pain:  Vitals:   06/09/17 1122  TempSrc: Temporal         Complications: No apparent anesthesia complications

## 2017-06-09 NOTE — Anesthesia Preprocedure Evaluation (Signed)
Anesthesia Evaluation  Patient identified by MRN, date of birth, ID band Patient awake    Reviewed: Allergy & Precautions, NPO status , Patient's Chart, lab work & pertinent test results  History of Anesthesia Complications Negative for: history of anesthetic complications  Airway Mallampati: III       Dental   Pulmonary neg sleep apnea, neg COPD,           Cardiovascular (-) hypertension(-) Past MI and (-) CHF (-) dysrhythmias (-) Valvular Problems/Murmurs     Neuro/Psych neg Seizures    GI/Hepatic Neg liver ROS, neg GERD  ,  Endo/Other  neg diabetes  Renal/GU negative Renal ROS     Musculoskeletal   Abdominal   Peds  Hematology   Anesthesia Other Findings   Reproductive/Obstetrics                             Anesthesia Physical Anesthesia Plan  ASA: II  Anesthesia Plan: General   Post-op Pain Management:    Induction:   PONV Risk Score and Plan: 3 and Ondansetron, Dexamethasone, Midazolam and Treatment may vary due to age or medical condition  Airway Management Planned: LMA  Additional Equipment:   Intra-op Plan:   Post-operative Plan:   Informed Consent: I have reviewed the patients History and Physical, chart, labs and discussed the procedure including the risks, benefits and alternatives for the proposed anesthesia with the patient or authorized representative who has indicated his/her understanding and acceptance.     Plan Discussed with:   Anesthesia Plan Comments:         Anesthesia Quick Evaluation

## 2017-06-09 NOTE — Discharge Instructions (Signed)

## 2017-06-10 LAB — SURGICAL PATHOLOGY

## 2017-06-10 NOTE — Op Note (Signed)
NAME:  Kristine Mcconnell, Kristine Mcconnell NO.:  [id number]  MEDICAL RECORD NO.:  LOCATION:                                 FACILITY:  PHYSICIAN:  Laverta Baltimore, MD     DATE OF BIRTH:  DATE OF PROCEDURE: DATE OF DISCHARGE:                              OPERATIVE REPORT   PREOPERATIVE DIAGNOSIS: 1. Menorrhagia. 2. Endometrial polyp.  POSTOPERATIVE DIAGNOSIS: 1. Menorrhagia. 2. Submucosal endometrial fibroid.  PROCEDURE PERFORMED: 1. Endocervical curettage. 2. Resection of submucosal endometrial fibroid with MyoSure. 3. Endometrial ablation with NovaSure.  SURGEON:  Laverta Baltimore, MD  FIRST ASSISTANT:  Scrub tech, Waves.  ANESTHESIA:  General endotracheal anesthesia.  INDICATIONS:  A 51 year old gravida 1, para 1 patient with heavy bleeding, heavy clot passage per cycle.  The patient with iron- deficiency anemia.  Saline infusion sonohysterography reveals a 3 x 2 cm endometrial polyp versus fibroid.  DESCRIPTION OF PROCEDURE:  After adequate general endotracheal anesthesia, the patient was placed in dorsal supine position, legs in the candy-cane stirrups.  Lower abdomen, perineal, and vagina were prepped and draped in normal sterile fashion.  The patient did receive 2 g IV cefoxitin prior to commencement of the case.  Time-out was performed.  The patient's bladder was catheterized with a Red Robinson catheter yielding 150 mL clear urine.  A weighted speculum was placed in the posterior vaginal vault.  The anterior cervix was grasped with a single-tooth tenaculum.  An endocervical curettage was performed, followed by uterine sounding of 9 cm.  Cervix was then dilated to #17 Hanks dilator without difficulty.  Hysteroscope was advanced into the endometrial cavity.  Of note, there was a large fleshy polyp, followed by a large submucosal fibroid on the left lateral aspect of the endometrial cavity approximately 3 cm.  MyoSure resector was brought up to the  operative field, and the polyp and submucosal fibroid were removed.  The 200 seconds of MyoSure resection time was documented. Good hemostasis was noted.  Pictures were taken.  Hysteroscope with MyoSure was removed and the NovaSure ablator was brought up to the off operative field and advanced into the endometrial cavity.  The array was opened and the uterine width was measured at 4.6 cm.  The cervical length was estimated at 4.5 cm, therefore the uterine cavity was 4.5 cm. Cavity assessment test was performed and passed and with the power setting of 114, the ablation took place for 1 minute 17 seconds.  The ablator was removed and repeat hysteroscopy demonstrated normal charring effect of the endometrial cavity.  Silver nitrate was used at the tenaculum site.  Good hemostasis was noted.  There were no complications.  ESTIMATED BLOOD LOSS:  Minimal.  INTRAOPERATIVE FLUIDS:  450 mL.  URINE OUTPUT:  150 mL.  Net deficit of normal saline during the MyoSure was 330 mL.  The patient was taken to recovery room in good condition.          ______________________________ Laverta Baltimore, MD     TS/MEDQ  D:  06/09/2017  T:  06/09/2017  Job:  [id number]

## 2017-09-09 ENCOUNTER — Encounter: Payer: Self-pay | Admitting: Family Medicine

## 2017-09-09 ENCOUNTER — Ambulatory Visit: Payer: 59 | Admitting: Family Medicine

## 2017-09-09 VITALS — BP 122/74 | HR 73 | Temp 98.6°F | Resp 14 | Wt 231.0 lb

## 2017-09-09 DIAGNOSIS — M25512 Pain in left shoulder: Secondary | ICD-10-CM

## 2017-09-09 MED ORDER — MELOXICAM 15 MG PO TABS: 15.0000 mg | ORAL_TABLET | Freq: Every day | ORAL | 0 refills | Status: DC

## 2017-09-09 NOTE — Patient Instructions (Addendum)

## 2017-09-09 NOTE — Progress Notes (Signed)
Name: Kristine Mcconnell   MRN: [id number]    DOB: 1966/08/28   Date:09/09/2017       Progress Note  Subjective  Chief Complaint  Chief Complaint  Patient presents with  . Shoulder Pain    Left shoulder more with movement, not a constant pain. Started  5 months ago    HPI  Pt presents with 5 months of LEFT shoulder pain.  Pain has remained about the same, but she has had decreasing AROM - having trouble putting jacket on, has had 3 episodes of difficulty lifting an item because of pain and feeling weak in the shoulder. She did "bootcamp" work outs for several months, stopped when shoulder pain started.  She denies numbness and tingling, no known injury, no repetitive motions at work, but does work on a computer daily, no back or neck pain, no joint swelling or erythema. Has not tried anything for her pain - no medications, heat/ice, or exercises.  Patient Active Problem List   Diagnosis Date Noted  . Obesity (BMI 35.0-39.9 without comorbidity) 06/10/2016  . Light headedness 10/26/2015  . Right ankle pain 10/26/2015    Social History   Tobacco Use  . Smoking status: Never Smoker  . Smokeless tobacco: Never Used  Substance Use Topics  . Alcohol use: Yes    Alcohol/week: 0.0 oz    Comment: OCC     Current Outpatient Medications:  .  meloxicam (MOBIC) 15 MG tablet, Take 1 tablet (15 mg total) by mouth daily., Disp: 30 tablet, Rfl: 0 .  phentermine (ADIPEX-P) 37.5 MG tablet, Take 37.5 mg by mouth daily., Disp: , Rfl: 0  Allergies  Allergen Reactions  . Other     BAND-AIDS IRRITATE SKIN-PT PREFERS TAPE    ROS  Ten systems reviewed and is negative except as mentioned in HPI  Objective  Vitals:   09/09/17 1456  BP: 122/74  Pulse: 73  Resp: 14  Temp: 98.6 F (37 C)  TempSrc: Oral  SpO2: 98%  Weight: 231 lb (104.8 kg)   Body mass index is 38.44 kg/m.  Nursing Note and Vital Signs reviewed.  Physical Exam  Constitutional: Patient appears well-developed and  well-nourished. Obese No distress.  HEENT: head atraumatic, normocephalic Cardiovascular: Normal rate, regular rhythm, S1/S2 present.  No murmur or rub heard. No BLE edema. Pulmonary/Chest: Effort normal and breath sounds clear. No respiratory distress or retractions. Psychiatric: Patient has a normal mood and affect. behavior is normal. Judgment and thought content normal. MSK: AROM of LEFT shoulder joint - limited to about 100 degrees on abduction as well as anterior and posterior extension.  Non-tender on palpation.  No swelling or effusion.  No results found for this or any previous visit (from the past 2160 hour(s)).   Assessment & Plan  1. Acute pain of left shoulder - meloxicam (MOBIC) 15 MG tablet; Take 1 tablet (15 mg total) by mouth daily.  Dispense: 30 tablet; Refill: 0 - Ambulatory referral to Orthopedic Surgery - 9:30am 09/10/2017 with Dr. Harlow Mares at [facility].  -Red flags and when to present for emergency care or RTC including fever >101.50F, chest pain, shortness of breath, new/worsening/un-resolving symptoms, extremity weakness, numbness/tingling, redness or swelling of joint, reviewed with patient at time of visit. Follow up and care instructions discussed and provided in AVS. -Reviewed Health Maintenance: Declines flu shot.

## 2018-03-10 ENCOUNTER — Other Ambulatory Visit: Payer: Self-pay | Admitting: Obstetrics and Gynecology

## 2018-03-10 DIAGNOSIS — Z1231 Encounter for screening mammogram for malignant neoplasm of breast: Secondary | ICD-10-CM

## 2018-05-11 ENCOUNTER — Ambulatory Visit
Admission: RE | Admit: 2018-05-11 | Discharge: 2018-05-11 | Disposition: A | Discharge status: Home / Self Care | Payer: Managed Care, Other (non HMO) | Source: Ambulatory Visit | Attending: Obstetrics and Gynecology | Admitting: Obstetrics and Gynecology

## 2018-05-11 DIAGNOSIS — Z1231 Encounter for screening mammogram for malignant neoplasm of breast: Secondary | ICD-10-CM | POA: Insufficient documentation

## 2018-06-04 ENCOUNTER — Telehealth: Payer: Self-pay | Admitting: Family Medicine

## 2018-06-04 ENCOUNTER — Encounter: Payer: Self-pay | Admitting: Family Medicine

## 2018-06-04 ENCOUNTER — Ambulatory Visit: Payer: 59 | Admitting: Family Medicine

## 2018-06-04 VITALS — BP 138/76 | HR 91 | Temp 98.2°F | Resp 16 | Ht 65.0 in | Wt 232.9 lb

## 2018-06-04 DIAGNOSIS — Z1212 Encounter for screening for malignant neoplasm of rectum: Principal | ICD-10-CM

## 2018-06-04 DIAGNOSIS — Z114 Encounter for screening for human immunodeficiency virus [HIV]: Secondary | ICD-10-CM

## 2018-06-04 DIAGNOSIS — R5383 Other fatigue: Secondary | ICD-10-CM

## 2018-06-04 DIAGNOSIS — H1012 Acute atopic conjunctivitis, left eye: Secondary | ICD-10-CM

## 2018-06-04 DIAGNOSIS — Z113 Encounter for screening for infections with a predominantly sexual mode of transmission: Secondary | ICD-10-CM

## 2018-06-04 DIAGNOSIS — E669 Obesity, unspecified: Secondary | ICD-10-CM

## 2018-06-04 DIAGNOSIS — Z23 Encounter for immunization: Secondary | ICD-10-CM

## 2018-06-04 DIAGNOSIS — D649 Anemia, unspecified: Secondary | ICD-10-CM | POA: Insufficient documentation

## 2018-06-04 DIAGNOSIS — R030 Elevated blood-pressure reading, without diagnosis of hypertension: Secondary | ICD-10-CM | POA: Diagnosis not present

## 2018-06-04 DIAGNOSIS — Z1211 Encounter for screening for malignant neoplasm of colon: Secondary | ICD-10-CM

## 2018-06-04 DIAGNOSIS — F430 Acute stress reaction: Secondary | ICD-10-CM

## 2018-06-04 DIAGNOSIS — Z862 Personal history of diseases of the blood and blood-forming organs and certain disorders involving the immune mechanism: Secondary | ICD-10-CM

## 2018-06-04 DIAGNOSIS — Z13220 Encounter for screening for lipoid disorders: Secondary | ICD-10-CM

## 2018-06-04 MED ORDER — HYDROXYZINE HCL 10 MG PO TABS: 10.0000 mg | ORAL_TABLET | Freq: Three times a day (TID) | ORAL | 0 refills | Status: DC | PRN

## 2018-06-04 NOTE — Progress Notes (Signed)
Name: Kristine Mcconnell   MRN: [id number]    DOB: April 26, 1966   Date:06/04/2018       Progress Note  Subjective  Chief Complaint  Chief Complaint  Patient presents with  . Hypertension    elevetaed BP 180/90 yesterdat at [facility], headache  . Obesity    assesement form for [facility] duscussing weight  . Eye Problem    eyes reds today  . Stress    HPI  Elevated BP reading: Went to [facility] and had elevated readings of 180/90, and 178/88.  She has never had dx of HTN in the past, does not take medications - no TIAs, no chest pain on exertion, no dyspnea on exertion, no swelling of ankles, no orthopnea or paroxysmal nocturnal dyspnea, no palpitations - DASH diet discussed - pt does not follow a low sodium diet; salt not added to cooking and salt shaker not on table - The following  are contributing factors: stress, sedentary lifestyle.   Stress - best friend's husband passed away, two other friends had MI's recently, her mother is ill, and several family members have had recent car accidents, a friend's sister recently passed away from cancer, her sister was hospitalized recently for TIAs - and several other significant stressors all in the last 3 months.   Obesity: Body mass index is 38.76 kg/m. Weight Management History: Diet: frequent dining out, excessive fat intake and sugary beverages; does not eat a lot of bread. Exercise: Started going back to the gym 2 times a week for about an hour.  Co-Morbid Conditions: none; 2 or more of these conditions combined with BMI >30 is considered morbid obesity; is this diagnosis appropriate and/or added to patient's problem list? No  Discussed Belviq and Saxenda as options for patient - she would like to research saxenda.  She denies family or personal history of pancreatitis or thyroid cancers. Goal #1: Increase to 3 times a week at the Gym. Goal #2: Decrease fast food/restaurant intake.  Eye Concern: She notes conjunctival redness  with minimal itching that occurs around this time each year; does not use drops and declines eye drops today.  Discussed changing mascara frequently to avoid bacterial infection; denies itching, pain, or vision changes.  Anemia - Taking an OTC iron supplement; would like levels checked today  B12 - taking supplement, feels like this helps her energy levels; no former diagnosis  Vitamin D - not taking supplement, but does have some fatigue, we will check today  Patient Active Problem List   Diagnosis Date Noted  . Obesity (BMI 35.0-39.9 without comorbidity) 06/10/2016  . Light headedness 10/26/2015  . Right ankle pain 10/26/2015    Past Surgical History:  Procedure Laterality Date  . BREAST CYST ASPIRATION Left 1990   benign  . DILATATION & CURETTAGE/HYSTEROSCOPY WITH MYOSURE N/A 06/09/2017   Procedure: DILATATION & CURETTAGE/HYSTEROSCOPY WITH MYOSURE RESECTION OF FIBROID;  Surgeon: Schermerhorn, Gwen Her, MD;  Location: [facility] ORS;  Service: Gynecology;  Laterality: N/A;  . FOOT SURGERY Right 2011  . HYSTEROSCOPY WITH NOVASURE N/A 06/09/2017   Procedure: HYSTEROSCOPY WITH NOVASURE;  Surgeon: Schermerhorn, Gwen Her, MD;  Location: [facility] ORS;  Service: Gynecology;  Laterality: N/A;  . TONSILLECTOMY  1977    Family History  Problem Relation Age of Onset  . Diabetes Mother   . Hypertension Mother   . Hypertension Father   . Breast cancer Neg Hx     Social History   Socioeconomic History  . Marital status:  Single    Spouse name: Not on file  . Number of children: 1  . Years of education: Not on file  . Highest education level: Not on file  Occupational History  . Occupation: Facilities manager: [employer]  . Financial resource strain: Not hard at all  . Food insecurity:    Worry: Never true    Inability: Never true  . Transportation needs:    Medical: No    Non-medical: No  Tobacco Use  . Smoking status: Never Smoker  . Smokeless tobacco: Never Used    Substance and Sexual Activity  . Alcohol use: Yes    Alcohol/week: 0.0 standard drinks    Comment: OCC  . Drug use: No  . Sexual activity: Yes    Partners: Male  Lifestyle  . Physical activity:    Days per week: 2 days    Minutes per session: 60 min  . Stress: Very much  Relationships  . Social connections:    Talks on phone: More than three times a week    Gets together: More than three times a week    Attends religious service: More than 4 times per year    Active member of club or organization: Yes    Attends meetings of clubs or organizations: More than 4 times per year    Relationship status: Not on file  . Intimate partner violence:    Fear of current or ex partner: No    Emotionally abused: No    Physically abused: No    Forced sexual activity: No  Other Topics Concern  . Not on file  Social History Narrative  . Not on file    Current Outpatient Medications:  .  meloxicam (MOBIC) 15 MG tablet, Take 1 tablet (15 mg total) by mouth daily. (Patient not taking: Reported on 06/04/2018), Disp: 30 tablet, Rfl: 0 .  phentermine (ADIPEX-P) 37.5 MG tablet, Take 37.5 mg by mouth daily., Disp: , Rfl: 0  Allergies  Allergen Reactions  . Other     BAND-AIDS IRRITATE SKIN-PT PREFERS TAPE    I personally reviewed active problem list, medication list, allergies, family history, social history, health maintenance with the patient/caregiver today.  ROS Constitutional: Negative for fever or weight change.  Respiratory: Negative for cough and shortness of breath.   Cardiovascular: Negative for chest pain or palpitations.  Gastrointestinal: Negative for abdominal pain, no bowel changes.  Musculoskeletal: Negative for gait problem or joint swelling.  Skin: Negative for rash.  Neurological: Negative for dizziness or headache.  No other specific complaints in a complete review of systems (except as listed in HPI above).  Objective  Vitals:   06/04/18 1433  BP: 138/76  Pulse:  91  Resp: 16  Temp: 98.2 F (36.8 C)  SpO2: 99%  Weight: 232 lb 14.4 oz (105.6 kg)  Height: 5\' 5"  (1.651 m)   Body mass index is 38.76 kg/m.  Physical Exam Constitutional: Patient appears well-developed and well-nourished. No distress.  HENT: Head: Normocephalic and atraumatic. Ears: bilateral TMs with no erythema or effusion; Nose: Nose normal. Mouth/Throat: Oropharynx is clear and moist. No oropharyngeal exudate or tonsillar swelling.  Eyes: LEFT conjunctiva exhibits mild erythema, Right conjunctiva is normal No scleral icterus.  Pupils are equal, round, and reactive to light.  Neck: Normal range of motion. Neck supple. No JVD present. No thyromegaly present.  Cardiovascular: Normal rate, regular rhythm and normal heart sounds.  No murmur heard. No BLE edema.  Pulmonary/Chest: Effort normal and breath sounds normal. No respiratory distress. Musculoskeletal: Normal range of motion, no joint effusions. No gross deformities Neurological: Pt is alert and oriented to person, place, and time. No cranial nerve deficit. Coordination, balance, strength, speech and gait are normal.  Skin: Skin is warm and dry. No rash noted. No erythema.  Psychiatric: Patient has a normal mood and affect. behavior is normal. Judgment and thought content normal.  No results found for this or any previous visit (from the past 72 hour(s)).  PHQ2/9: Depression screen [facility] 2/9 06/04/2018 06/04/2018 09/09/2017 06/10/2016 10/26/2015  Decreased Interest 0 0 0 0 0  Down, Depressed, Hopeless 0 0 0 0 0  PHQ - 2 Score 0 0 0 0 0  Altered sleeping 1 - - - -  Tired, decreased energy 1 - - - -  Change in appetite 0 - - - -  Feeling bad or failure about yourself  0 - - - -  Trouble concentrating 0 - - - -  Moving slowly or fidgety/restless 0 - - - -  Suicidal thoughts 0 - - - -  PHQ-9 Score 2 - - - -  Difficult doing work/chores Not difficult at all - - - -   Fall Risk: Fall Risk  06/04/2018 09/09/2017 06/10/2016 10/26/2015    Falls in the past year? No No No No    Assessment & Plan  1. Obesity (BMI 35.0-39.9 without comorbidity) - Discussed importance of 150 minutes of physical activity weekly, eat two servings of fish weekly, eat one serving of tree nuts ( cashews, pistachios, pecans, almonds.Marland Kitchen) every other day, eat 6 servings of fruit/vegetables daily and drink plenty of water and avoid sweet beverages.  - See goals in HPI - TSH - Comprehensive Metabolic Panel (CMET)  2. Elevated blood pressure reading - Advised 1 elevated reading does not require medication; BP is high end of normal today; return in 2 weeks for recheck and we will consider medication at that time if warranted.  DASH diet discussed, stress management discussed - Comprehensive Metabolic Panel (CMET)  3. Needs flu shot - Declines  4. Fatigue, unspecified type - TSH - CBC w/Diff/Platelet - Iron, TIBC and Ferritin Panel - Vitamin B12 - Vitamin D (25 hydroxy) - Comprehensive Metabolic Panel (CMET)  5. Acute reaction to stress - hydrOXYzine (ATARAX/VISTARIL) 10 MG tablet; Take 1 tablet (10 mg total) by mouth every 8 (eight) hours as needed for anxiety.  Dispense: 30 tablet; Refill: 0 - Stress management discussed in detail.  6. Screening for hyperlipidemia - Lipid panel  7. History of anemia - CBC w/Diff/Platelet - Iron, TIBC and Ferritin Panel  8. Screening for HIV without presence of risk factors - HIV Antibody (routine testing w rflx)  9. Routine screening for STI (sexually transmitted infection) - HIV Antibody (routine testing w rflx) - RPR - GC/Chlamydia Probe Amp([facility])  10. Allergic conjunctivitis of left eye Declines eye drops today; will monitor for signs of infection; return precautions discussed

## 2018-06-04 NOTE — Telephone Encounter (Signed)
Please find out if patient had colonoscopy with Dr. Jamal Collin or Dr. Bary Castilla in the last 10 years and what their recommendation was.

## 2018-06-04 NOTE — Patient Instructions (Signed)
Liraglutide injection (Weight Management) What is this medicine? LIRAGLUTIDE (LIR a GLOO tide) is used with a reduced calorie diet and exercise to help you lose weight. This medicine may be used for other purposes; ask your health care provider or pharmacist if you have questions. COMMON BRAND NAME(S): Saxenda What should I tell my health care provider before I take this medicine? They need to know if you have any of these conditions: -endocrine tumors (MEN 2) or if someone in your family had these tumors -gallbladder disease -high cholesterol -history of alcohol abuse problem -history of pancreatitis -kidney disease or if you are on dialysis -liver disease -previous swelling of the tongue, face, or lips with difficulty breathing, difficulty swallowing, hoarseness, or tightening of the throat -stomach problems -suicidal thoughts, plans, or attempt; a previous suicide attempt by you or a family member -thyroid cancer or if someone in your family had thyroid cancer -an unusual or allergic reaction to liraglutide, other medicines, foods, dyes, or preservatives -pregnant or trying to get pregnant -breast-feeding How should I use this medicine? This medicine is for injection under the skin of your upper leg, stomach area, or upper arm. You will be taught how to prepare and give this medicine. Use exactly as directed. Take your medicine at regular intervals. Do not take it more often than directed. It is important that you put your used needles and syringes in a special sharps container. Do not put them in a trash can. If you do not have a sharps container, call your pharmacist or healthcare provider to get one. A special MedGuide will be given to you by the pharmacist with each prescription and refill. Be sure to read this information carefully each time. Talk to your pediatrician regarding the use of this medicine in children. Special care may be needed. Overdosage: If you think you have taken  too much of this medicine contact a poison control center or emergency room at once. NOTE: This medicine is only for you. Do not share this medicine with others. What if I miss a dose? If you miss a dose, take it as soon as you can. If it is almost time for your next dose, take only that dose. Do not take double or extra doses. If you miss your dose for 3 days or more, call your doctor or health care professional to talk about how to restart this medicine. What may interact with this medicine? -insulin and other medicines for diabetes This list may not describe all possible interactions. Give your health care provider a list of all the medicines, herbs, non-prescription drugs, or dietary supplements you use. Also tell them if you smoke, drink alcohol, or use illegal drugs. Some items may interact with your medicine. What should I watch for while using this medicine? Visit your doctor or health care professional for regular checks on your progress. This medicine is intended to be used in addition to a healthy diet and appropriate exercise. The best results are achieved this way. Do not increase or in any way change your dose without consulting your doctor or health care professional. Drink plenty of fluids while taking this medicine. Check with your doctor or health care professional if you get an attack of severe diarrhea, nausea, and vomiting. The loss of too much body fluid can make it dangerous for you to take this medicine. This medicine may affect blood sugar levels. If you have diabetes, check with your doctor or health care professional before you change your diet  or the dose of your diabetic medicine. Patients and their families should watch out for worsening depression or thoughts of suicide. Also watch out for sudden changes in feelings such as feeling anxious, agitated, panicky, irritable, hostile, aggressive, impulsive, severely restless, overly excited and hyperactive, or not being able to  sleep. If this happens, especially at the beginning of treatment or after a change in dose, call your health care professional. What side effects may I notice from receiving this medicine? Side effects that you should report to your doctor or health care professional as soon as possible: -allergic reactions like skin rash, itching or hives, swelling of the face, lips, or tongue -breathing problems -diarrhea that continues or is severe -lump or swelling on the neck -severe nausea -signs and symptoms of infection like fever or chills; cough; sore throat; pain or trouble passing urine -signs and symptoms of low blood sugar such as feeling anxious, confusion, dizziness, increased hunger, unusually weak or tired, sweating, shakiness, cold, irritable, headache, blurred vision, fast heartbeat, loss of consciousness -signs and symptoms of kidney injury like trouble passing urine or change in the amount of urine -trouble swallowing -unusual stomach upset or pain -vomiting Side effects that usually do not require medical attention (report to your doctor or health care professional if they continue or are bothersome): -constipation -decreased appetite -diarrhea -fatigue -headache -nausea -pain, redness, or irritation at site where injected -stomach upset -stuffy or runny nose This list may not describe all possible side effects. Call your doctor for medical advice about side effects. You may report side effects to FDA at 1-800-FDA-1088. Where should I keep my medicine? Keep out of the reach of children. Store unopened pen in a refrigerator between 2 and 8 degrees C (36 and 46 degrees F). Do not freeze or use if the medicine has been frozen. Protect from light and excessive heat. After you first use the pen, it can be stored at room temperature between 15 and 30 degrees C (59 and 86 degrees F) or in a refrigerator. Throw away your used pen after 30 days or after the expiration date, whichever comes  first. Do not store your pen with the needle attached. If the needle is left on, medicine may leak from the pen. NOTE: This sheet is a summary. It may not cover all possible information. If you have questions about this medicine, talk to your doctor, pharmacist, or health care provider.  2018 Elsevier/Gold Standard (2016-09-26 14:41:37)

## 2018-06-05 NOTE — Telephone Encounter (Signed)
Per Raquel Sarna did not have colonoscopy at [place]

## 2018-06-05 NOTE — Telephone Encounter (Signed)
Please let patient know that she needs colonoscopy as we cannot locate records of previous.  I am placing the order today.

## 2018-06-09 ENCOUNTER — Telehealth: Payer: Self-pay | Admitting: Family Medicine

## 2018-06-09 ENCOUNTER — Other Ambulatory Visit: Payer: Self-pay

## 2018-06-09 DIAGNOSIS — Z800 Family history of malignant neoplasm of digestive organs: Secondary | ICD-10-CM

## 2018-06-09 DIAGNOSIS — Z1211 Encounter for screening for malignant neoplasm of colon: Secondary | ICD-10-CM

## 2018-06-09 NOTE — Telephone Encounter (Signed)
Please call to remind Kristine Mcconnell to come in for fasting labs.

## 2018-06-09 NOTE — Telephone Encounter (Signed)
Left message for patient

## 2018-06-10 ENCOUNTER — Other Ambulatory Visit: Payer: Self-pay | Admitting: Family Medicine

## 2018-06-10 DIAGNOSIS — R7989 Other specified abnormal findings of blood chemistry: Secondary | ICD-10-CM

## 2018-06-10 DIAGNOSIS — Z1159 Encounter for screening for other viral diseases: Secondary | ICD-10-CM

## 2018-06-10 LAB — CBC WITH DIFFERENTIAL/PLATELET
BASOS: 0 %
Basophils Absolute: 0 10*3/uL (ref 0.0–0.2)
EOS (ABSOLUTE): 0.1 10*3/uL (ref 0.0–0.4)
EOS: 2 %
HEMATOCRIT: 36.5 % (ref 34.0–46.6)
Hemoglobin: 11.8 g/dL (ref 11.1–15.9)
IMMATURE GRANULOCYTES: 0 %
Immature Grans (Abs): 0 10*3/uL (ref 0.0–0.1)
LYMPHS ABS: 1.5 10*3/uL (ref 0.7–3.1)
Lymphs: 33 %
MCH: 25.7 pg — ABNORMAL LOW (ref 26.6–33.0)
MCHC: 32.3 g/dL (ref 31.5–35.7)
MCV: 80 fL (ref 79–97)
MONOCYTES: 10 %
MONOS ABS: 0.5 10*3/uL (ref 0.1–0.9)
NEUTROS PCT: 55 %
Neutrophils Absolute: 2.4 10*3/uL (ref 1.4–7.0)
Platelets: 326 10*3/uL (ref 150–450)
RBC: 4.59 x10E6/uL (ref 3.77–5.28)
RDW: 15.8 % — ABNORMAL HIGH (ref 12.3–15.4)
WBC: 4.5 10*3/uL (ref 3.4–10.8)

## 2018-06-10 LAB — LIPID PANEL
CHOL/HDL RATIO: 2.8 ratio (ref 0.0–4.4)
CHOLESTEROL TOTAL: 157 mg/dL (ref 100–199)
HDL: 57 mg/dL (ref >39)
LDL CALC: 86 mg/dL (ref 0–99)
TRIGLYCERIDES: 69 mg/dL (ref 0–149)
VLDL Cholesterol Cal: 14 mg/dL (ref 5–40)

## 2018-06-10 LAB — COMPREHENSIVE METABOLIC PANEL
A/G RATIO: 1.9 (ref 1.2–2.2)
ALK PHOS: 97 IU/L (ref 39–117)
ALT: 14 IU/L (ref 0–32)
AST: 15 IU/L (ref 0–40)
Albumin: 4.2 g/dL (ref 3.5–5.5)
BUN/Creatinine Ratio: 11 (ref 9–23)
BUN: 8 mg/dL (ref 6–24)
Bilirubin Total: 0.4 mg/dL (ref 0.0–1.2)
CALCIUM: 9.2 mg/dL (ref 8.7–10.2)
CHLORIDE: 102 mmol/L (ref 96–106)
CO2: 24 mmol/L (ref 20–29)
Creatinine, Ser: 0.7 mg/dL (ref 0.57–1.00)
GFR calc Af Amer: 117 mL/min/{1.73_m2} (ref >59)
GFR calc non Af Amer: 101 mL/min/{1.73_m2} (ref >59)
Globulin, Total: 2.2 g/dL (ref 1.5–4.5)
Glucose: 108 mg/dL — ABNORMAL HIGH (ref 65–99)
POTASSIUM: 4.3 mmol/L (ref 3.5–5.2)
Sodium: 140 mmol/L (ref 134–144)
Total Protein: 6.4 g/dL (ref 6.0–8.5)

## 2018-06-10 LAB — IRON,TIBC AND FERRITIN PANEL
Ferritin: 27 ng/mL (ref 15–150)
Iron Saturation: 20 % (ref 15–55)
Iron: 57 ug/dL (ref 27–159)
Total Iron Binding Capacity: 282 ug/dL (ref 250–450)
UIBC: 225 ug/dL (ref 131–425)

## 2018-06-10 LAB — VITAMIN D 25 HYDROXY (VIT D DEFICIENCY, FRACTURES): VIT D 25 HYDROXY: 29.7 ng/mL — AB (ref 30.0–100.0)

## 2018-06-10 LAB — RPR: RPR: NONREACTIVE

## 2018-06-10 LAB — VITAMIN B12: VITAMIN B 12: 1685 pg/mL — AB (ref 232–1245)

## 2018-06-10 LAB — HIV ANTIBODY (ROUTINE TESTING W REFLEX): HIV SCREEN 4TH GENERATION: NONREACTIVE

## 2018-06-10 LAB — TSH: TSH: 5.22 u[IU]/mL — ABNORMAL HIGH (ref 0.450–4.500)

## 2018-06-16 ENCOUNTER — Telehealth: Payer: Self-pay | Admitting: Gastroenterology

## 2018-06-16 NOTE — Telephone Encounter (Signed)
Pt left vmail. Needs to cancel Colon test on Oct 3rd.

## 2018-06-16 NOTE — Telephone Encounter (Signed)
Patients colonoscopy has been rescheduled from October 3rd to October 10th.  Notified Trish in Endoscopy and updated referral.  Thanks Sharyn Lull

## 2018-06-17 ENCOUNTER — Ambulatory Visit: Payer: Managed Care, Other (non HMO) | Admitting: Emergency Medicine

## 2018-06-17 DIAGNOSIS — R7989 Other specified abnormal findings of blood chemistry: Secondary | ICD-10-CM

## 2018-06-17 DIAGNOSIS — Z1159 Encounter for screening for other viral diseases: Secondary | ICD-10-CM

## 2018-06-26 ENCOUNTER — Telehealth: Payer: Self-pay

## 2018-06-26 ENCOUNTER — Other Ambulatory Visit: Payer: Self-pay

## 2018-06-26 DIAGNOSIS — Z1211 Encounter for screening for malignant neoplasm of colon: Secondary | ICD-10-CM

## 2018-06-26 NOTE — Telephone Encounter (Signed)
Patient contacted office to change colonoscopy date from 10/10 to 11/11. New order has been entered, new letter sent, referral has been updated.  Thanks [employer]

## 2018-07-02 ENCOUNTER — Ambulatory Visit: Admit: 2018-07-02 | Payer: Managed Care, Other (non HMO) | Admitting: Gastroenterology

## 2018-07-02 SURGERY — COLONOSCOPY WITH PROPOFOL
Anesthesia: General

## 2018-07-20 ENCOUNTER — Telehealth: Payer: Self-pay | Admitting: Family Medicine

## 2018-07-20 DIAGNOSIS — R7989 Other specified abnormal findings of blood chemistry: Secondary | ICD-10-CM

## 2018-07-20 NOTE — Telephone Encounter (Signed)
-----   Message from Hubbard Hartshorn, FNP sent at 06/11/2018 10:31 AM EDT ----- Regarding: Call to have patient come in for TSH recheck and Hep C screening Call to have patient come in for TSH recheck and Hep C screening

## 2018-07-21 ENCOUNTER — Other Ambulatory Visit: Payer: Self-pay | Admitting: Emergency Medicine

## 2018-07-21 DIAGNOSIS — Z1159 Encounter for screening for other viral diseases: Secondary | ICD-10-CM

## 2018-07-21 DIAGNOSIS — R7989 Other specified abnormal findings of blood chemistry: Secondary | ICD-10-CM

## 2018-07-21 NOTE — Telephone Encounter (Signed)
Paperwork up front and changed to [place]. Left message for patient

## 2018-07-23 ENCOUNTER — Telehealth: Payer: Self-pay | Admitting: Gastroenterology

## 2018-07-23 NOTE — Telephone Encounter (Signed)
Patient called and has a colonoscopy scheduled for 08-03-18 with Dr Marius Ditch. She has ask that we sent out paperwork again to include the script & paperwork for the procedure.She has missed placed the one she received.

## 2018-07-27 NOTE — Telephone Encounter (Signed)
Patient will come to office to pick up bowel prep and instructions

## 2018-07-31 ENCOUNTER — Encounter: Payer: Self-pay | Admitting: *Deleted

## 2018-07-31 LAB — THYROID PANEL WITH TSH
Free Thyroxine Index: 2.2 (ref 1.2–4.9)
T3 UPTAKE RATIO: 22 % — AB (ref 24–39)
T4, Total: 10 ug/dL (ref 4.5–12.0)
TSH: 2.22 u[IU]/mL (ref 0.450–4.500)

## 2018-07-31 LAB — HEPATITIS C ANTIBODY: Hep C Virus Ab: 0.1 s/co ratio (ref 0.0–0.9)

## 2018-08-03 ENCOUNTER — Ambulatory Visit: Payer: Managed Care, Other (non HMO) | Admitting: Certified Registered Nurse Anesthetist

## 2018-08-03 ENCOUNTER — Other Ambulatory Visit: Payer: Self-pay | Admitting: Family Medicine

## 2018-08-03 ENCOUNTER — Encounter
Admission: RE | Disposition: A | Discharge status: Home / Self Care | Payer: Self-pay | Source: Ambulatory Visit | Attending: Gastroenterology

## 2018-08-03 ENCOUNTER — Encounter: Payer: Self-pay | Admitting: *Deleted

## 2018-08-03 ENCOUNTER — Ambulatory Visit
Admission: RE | Admit: 2018-08-03 | Discharge: 2018-08-03 | Disposition: A | Discharge status: Home / Self Care | Payer: Managed Care, Other (non HMO) | Source: Ambulatory Visit | Attending: Gastroenterology | Admitting: Gastroenterology

## 2018-08-03 DIAGNOSIS — Z1211 Encounter for screening for malignant neoplasm of colon: Secondary | ICD-10-CM | POA: Insufficient documentation

## 2018-08-03 DIAGNOSIS — R6889 Other general symptoms and signs: Secondary | ICD-10-CM

## 2018-08-03 HISTORY — PX: COLONOSCOPY WITH PROPOFOL: SHX5780

## 2018-08-03 LAB — POCT PREGNANCY, URINE: PREG TEST UR: NEGATIVE

## 2018-08-03 SURGERY — COLONOSCOPY WITH PROPOFOL
Anesthesia: General

## 2018-08-03 MED ORDER — PROPOFOL 500 MG/50ML IV EMUL
INTRAVENOUS | Status: DC | PRN
  Administered 2018-08-03: 160 ug/kg/min via INTRAVENOUS

## 2018-08-03 MED ORDER — SODIUM CHLORIDE 0.9 % IV SOLN
INTRAVENOUS | Status: DC
  Administered 2018-08-03: 11:00:00 via INTRAVENOUS

## 2018-08-03 MED ORDER — PROPOFOL 10 MG/ML IV BOLUS
INTRAVENOUS | Status: DC | PRN
  Administered 2018-08-03: 70 mg via INTRAVENOUS

## 2018-08-03 NOTE — Transfer of Care (Signed)
Immediate Anesthesia Transfer of Care Note  Patient: NEFTALI THUROW  Procedure(s) Performed: COLONOSCOPY WITH PROPOFOL (N/A )  Patient Location: PACU  Anesthesia Type:General  Level of Consciousness: drowsy  Airway & Oxygen Therapy: Patient Spontanous Breathing and Patient connected to nasal cannula oxygen  Post-op Assessment: Report given to RN and Post -op Vital signs reviewed and stable  Post vital signs: Reviewed and stable  Last Vitals:  Vitals Value Taken Time  BP 122/71 08/03/2018 11:32 AM  Temp 36.1 C 08/03/2018 11:31 AM  Pulse 77 08/03/2018 11:32 AM  Resp 24 08/03/2018 11:32 AM  SpO2 98 % 08/03/2018 11:32 AM  Vitals shown include unvalidated device data.  Last Pain:  Vitals:   08/03/18 1131  TempSrc: Tympanic         Complications: No apparent anesthesia complications

## 2018-08-03 NOTE — H&P (Signed)
Cephas Darby, MD [address]  [place]  [place], [place] [address]  Main: [phone]  Fax: [phone] Pager: [phone]  Primary Care Physician:  Hubbard Hartshorn, FNP Primary Gastroenterologist:  Dr. Cephas Darby  Pre-Procedure History & Physical: HPI:  Kristine Mcconnell is a 52 y.o. female is here for an colonoscopy.   Past Medical History:  Diagnosis Date  . Anemia     Past Surgical History:  Procedure Laterality Date  . BREAST CYST ASPIRATION Left 1990   benign  . DILATATION & CURETTAGE/HYSTEROSCOPY WITH MYOSURE N/A 06/09/2017   Procedure: DILATATION & CURETTAGE/HYSTEROSCOPY WITH MYOSURE RESECTION OF FIBROID;  Surgeon: Schermerhorn, Gwen Her, MD;  Location: [facility] ORS;  Service: Gynecology;  Laterality: N/A;  . DILATION AND CURETTAGE OF UTERUS    . FOOT SURGERY Right 2011  . HYSTEROSCOPY WITH NOVASURE N/A 06/09/2017   Procedure: HYSTEROSCOPY WITH NOVASURE;  Surgeon: Schermerhorn, Gwen Her, MD;  Location: [facility] ORS;  Service: Gynecology;  Laterality: N/A;  . lapband    . TONSILLECTOMY  1977    Prior to Admission medications   Medication Sig Start Date End Date Taking? Authorizing Provider  hydrOXYzine (ATARAX/VISTARIL) 10 MG tablet Take 1 tablet (10 mg total) by mouth every 8 (eight) hours as needed for anxiety. Patient not taking: Reported on 08/03/2018 06/04/18   Hubbard Hartshorn, FNP  meloxicam (MOBIC) 15 MG tablet Take 1 tablet (15 mg total) by mouth daily. Patient not taking: Reported on 06/04/2018 09/09/17   Hubbard Hartshorn, FNP    Allergies as of 06/26/2018 - Review Complete 06/04/2018  Allergen Reaction Noted  . Other  06/05/2017    Family History  Problem Relation Age of Onset  . Diabetes Mother   . Hypertension Mother   . Hypertension Father   . Breast cancer Neg Hx     Social History   Socioeconomic History  . Marital status: Single    Spouse name: Not on file  . Number of children: 1  . Years of education: Not on file  . Highest education  level: Not on file  Occupational History  . Occupation: Facilities manager: [employer]  . Financial resource strain: Not hard at all  . Food insecurity:    Worry: Never true    Inability: Never true  . Transportation needs:    Medical: No    Non-medical: No  Tobacco Use  . Smoking status: Never Smoker  . Smokeless tobacco: Never Used  Substance and Sexual Activity  . Alcohol use: Yes    Alcohol/week: 0.0 standard drinks    Comment: OCC  . Drug use: No  . Sexual activity: Yes    Partners: Male  Lifestyle  . Physical activity:    Days per week: 2 days    Minutes per session: 60 min  . Stress: Very much  Relationships  . Social connections:    Talks on phone: More than three times a week    Gets together: More than three times a week    Attends religious service: More than 4 times per year    Active member of club or organization: Yes    Attends meetings of clubs or organizations: More than 4 times per year    Relationship status: Not on file  . Intimate partner violence:    Fear of current or ex partner: No    Emotionally abused: No    Physically abused: No    Forced sexual  activity: No  Other Topics Concern  . Not on file  Social History Narrative  . Not on file    Review of Systems: See HPI, otherwise negative ROS  Physical Exam: BP (!) 183/91   Pulse 73   Temp (!) 97 F (36.1 C) (Tympanic)   Resp 18   Ht 5\' 5"  (1.651 m)   Wt 105.6 kg   LMP 07/30/2018 (Exact Date)   SpO2 97%   BMI 38.74 kg/m  General:   Alert,  pleasant and cooperative in NAD Head:  Normocephalic and atraumatic. Neck:  Supple; no masses or thyromegaly. Lungs:  Clear throughout to auscultation.    Heart:  Regular rate and rhythm. Abdomen:  Soft, nontender and nondistended. Normal bowel sounds, without guarding, and without rebound.   Neurologic:  Alert and  oriented x4;  grossly normal neurologically.  Impression/Plan: Kristine Mcconnell is here for an colonoscopy to  be performed for colon cancer screening  Risks, benefits, limitations, and alternatives regarding  colonoscopy have been reviewed with the patient.  Questions have been answered.  All parties agreeable.   Sherri Sear, MD  08/03/2018, 10:54 AM

## 2018-08-03 NOTE — Anesthesia Postprocedure Evaluation (Signed)
Anesthesia Post Note  Patient: Kristine Mcconnell  Procedure(s) Performed: COLONOSCOPY WITH PROPOFOL (N/A )  Patient location during evaluation: PACU Anesthesia Type: General Level of consciousness: awake and alert Pain management: pain level controlled Vital Signs Assessment: post-procedure vital signs reviewed and stable Respiratory status: spontaneous breathing and respiratory function stable Cardiovascular status: stable Anesthetic complications: no     Last Vitals:  Vitals:   08/03/18 1037 08/03/18 1131  BP: (!) 183/91   Pulse: 73   Resp: 18   Temp: (!) 36.1 C (!) 36.1 C  SpO2: 97%     Last Pain:  Vitals:   08/03/18 1131  TempSrc: Tympanic                 , K

## 2018-08-03 NOTE — Op Note (Signed)
[facility] Gastroenterology Patient Name: Kristine Mcconnell Procedure Date: 08/03/2018 10:53 AM MRN: [id number] Account #: [id number] Date of Birth: 12-Apr-1966 Admit Type: Outpatient Age: 52 Room: [facility] ENDO ROOM 4 Gender: Female Note Status: Finalized Procedure:            Colonoscopy Indications:          Screening for colorectal malignant neoplasm, Last                        colonoscopy: May 2011 Providers:            Lin Landsman MD, MD Referring MD:         Cassie Freer, MD (Referring MD), No Local Md, MD                        (Referring MD) Medicines:            Monitored Anesthesia Care Complications:        No immediate complications. Estimated blood loss: None. Procedure:            Pre-Anesthesia Assessment:                       - Prior to the procedure, a History and Physical was                        performed, and patient medications and allergies were                        reviewed. The patient is competent. The risks and                        benefits of the procedure and the sedation options and                        risks were discussed with the patient. All questions                        were answered and informed consent was obtained.                        Patient identification and proposed procedure were                        verified by the physician, the nurse, the                        anesthesiologist, the anesthetist and the technician in                        the pre-procedure area in the procedure room in the                        endoscopy suite. Mental Status Examination: alert and                        oriented. Airway Examination: normal oropharyngeal                        airway and neck mobility. Respiratory Examination:  clear to auscultation. CV Examination: normal.                        Prophylactic Antibiotics: The patient does not require                        prophylactic  antibiotics. Prior Anticoagulants: The                        patient has taken no previous anticoagulant or                        antiplatelet agents. ASA Grade Assessment: II - A                        patient with mild systemic disease. After reviewing the                        risks and benefits, the patient was deemed in                        satisfactory condition to undergo the procedure. The                        anesthesia plan was to use monitored anesthesia care                        (MAC). Immediately prior to administration of                        medications, the patient was re-assessed for adequacy                        to receive sedatives. The heart rate, respiratory rate,                        oxygen saturations, blood pressure, adequacy of                        pulmonary ventilation, and response to care were                        monitored throughout the procedure. The physical status                        of the patient was re-assessed after the procedure.                       After obtaining informed consent, the colonoscope was                        passed under direct vision. Throughout the procedure,                        the patient's blood pressure, pulse, and oxygen                        saturations were monitored continuously. The                        Colonoscope  was introduced through the anus and                        advanced to the the terminal ileum, with identification                        of the appendiceal orifice and IC valve. The                        colonoscopy was performed without difficulty. The                        patient tolerated the procedure well. The quality of                        the bowel preparation was evaluated using the BBPS                        [facility] Bowel Preparation Scale) with scores of: Right                        Colon = 3, Transverse Colon = 3 and Left Colon = 3                        (entire  mucosa seen well with no residual staining,                        small fragments of stool or opaque liquid). The total                        BBPS score equals 9. Findings:      The digital rectal exam findings include palpable cervix/uterus ?       lesion. Pertinent negatives include normal sphincter tone.      The colon (entire examined portion) appeared normal. Extrensic       compression from uterus/cervix into the rectum.      The retroflexed view of the distal rectum and anal verge was normal and       showed no anal or rectal abnormalities.      The terminal ileum appeared normal. Impression:           - Palpable cervix/uterus ? lesion found on digital                        rectal exam.                       - The entire examined colon is normal.                       - The distal rectum and anal verge are normal on                        retroflexion view.                       - No specimens collected. Recommendation:       - Discharge patient to home (with escort).                       -  Resume previous diet today.                       - Continue present medications.                       - Repeat colonoscopy in 10 years for surveillance.                       - Refer to Gyn for the possible lesion of uterus/cervix                        if not evaluated before Procedure Code(s):    --- Professional ---                       Z6109, Colorectal cancer screening; colonoscopy on                        individual not meeting criteria for high risk Diagnosis Code(s):    --- Professional ---                       Z12.11, Encounter for screening for malignant neoplasm                        of colon CPT copyright 2018 American Medical Association. All rights reserved. The codes documented in this report are preliminary and upon coder review may  be revised to meet current compliance requirements. Dr. Ulyess Mort Lin Landsman MD, MD 08/03/2018 11:32:55 AM This report  has been signed electronically. Number of Addenda: 0 Note Initiated On: 08/03/2018 10:53 AM Scope Withdrawal Time: 0 hours 8 minutes 8 seconds  Total Procedure Duration: 0 hours 12 minutes 40 seconds       [facility]

## 2018-08-03 NOTE — Anesthesia Post-op Follow-up Note (Signed)
Anesthesia QCDR form completed.        

## 2018-08-03 NOTE — Anesthesia Preprocedure Evaluation (Signed)
Anesthesia Evaluation  Patient identified by MRN, date of birth, ID band Patient awake    Reviewed: Allergy & Precautions, NPO status , Patient's Chart, lab work & pertinent test results  History of Anesthesia Complications Negative for: history of anesthetic complications  Airway Mallampati: II       Dental   Pulmonary neg sleep apnea, neg COPD,           Cardiovascular (-) hypertension(-) Past MI and (-) CHF (-) dysrhythmias (-) Valvular Problems/Murmurs     Neuro/Psych neg Seizures    GI/Hepatic Neg liver ROS, neg GERD  ,  Endo/Other  neg diabetes  Renal/GU negative Renal ROS     Musculoskeletal   Abdominal   Peds  Hematology  (+) anemia ,   Anesthesia Other Findings   Reproductive/Obstetrics                             Anesthesia Physical Anesthesia Plan  ASA: II  Anesthesia Plan: General   Post-op Pain Management:    Induction:   PONV Risk Score and Plan: 3 and Propofol infusion, TIVA, Midazolam and Treatment may vary due to age or medical condition  Airway Management Planned: Nasal Cannula  Additional Equipment:   Intra-op Plan:   Post-operative Plan:   Informed Consent: I have reviewed the patients History and Physical, chart, labs and discussed the procedure including the risks, benefits and alternatives for the proposed anesthesia with the patient or authorized representative who has indicated his/her understanding and acceptance.     Plan Discussed with:   Anesthesia Plan Comments:         Anesthesia Quick Evaluation

## 2018-08-03 NOTE — Anesthesia Procedure Notes (Signed)
Performed by: Abdulhadi Stopa, CRNA Pre-anesthesia Checklist: Patient identified, Emergency Drugs available, Suction available, Patient being monitored and Timeout performed Patient Re-evaluated:Patient Re-evaluated prior to induction Oxygen Delivery Method: Nasal cannula Induction Type: IV induction       

## 2018-08-04 ENCOUNTER — Ambulatory Visit: Payer: Managed Care, Other (non HMO) | Admitting: Family Medicine

## 2018-08-04 ENCOUNTER — Encounter: Payer: Self-pay | Admitting: Gastroenterology

## 2018-08-13 ENCOUNTER — Ambulatory Visit: Payer: Managed Care, Other (non HMO) | Admitting: Family Medicine

## 2018-08-13 ENCOUNTER — Encounter: Payer: Self-pay | Admitting: Family Medicine

## 2018-08-13 VITALS — BP 128/70 | HR 85 | Temp 98.1°F | Resp 16 | Ht 65.0 in | Wt 228.0 lb

## 2018-08-13 DIAGNOSIS — R7989 Other specified abnormal findings of blood chemistry: Secondary | ICD-10-CM | POA: Diagnosis not present

## 2018-08-13 DIAGNOSIS — E669 Obesity, unspecified: Secondary | ICD-10-CM

## 2018-08-13 MED ORDER — LORCASERIN HCL 10 MG PO TABS: 10.0000 mg | ORAL_TABLET | Freq: Two times a day (BID) | ORAL | 2 refills | Status: DC

## 2018-08-13 NOTE — Progress Notes (Signed)
Name: Kristine Mcconnell   MRN: [id number]    DOB: 11/28/1965   Date:08/13/2018       Progress Note  Subjective  Chief Complaint  Chief Complaint  Patient presents with  . Follow-up    2 month recheck thyroid  . Obesity    HPI  Pt in for routine follow up, had thyroid check earlier in the month, TSH back to normal at 2.2 after a slight elevation 6 weeks prior.  She denies complaints or concerns. Pt reports she is trying to get to the gym more, she would like to loose some weight, no speicific routine at this time. Denies pain, chest pain or shortness of breath.  She declines referral to nutritionist, but would like to consider Saxenda or Belviq.  We will send in Belviq and follow up in 3-4 months - goal of 5% weight loss in 3 months.  Patient Active Problem List   Diagnosis Date Noted  . 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 Obesity (BMI 35.0-39.9 without comorbidity) 06/10/2016  . Light headedness 10/26/2015  . Right ankle pain 10/26/2015    Past Surgical History:  Procedure Laterality Date  . BREAST CYST ASPIRATION Left 1990   benign  . COLONOSCOPY WITH PROPOFOL N/A 08/03/2018   Procedure: COLONOSCOPY WITH PROPOFOL;  Surgeon: Lin Landsman, MD;  Location: [facility] ENDOSCOPY;  Service: Gastroenterology;  Laterality: N/A;  . DILATATION & CURETTAGE/HYSTEROSCOPY WITH MYOSURE N/A 06/09/2017   Procedure: DILATATION & CURETTAGE/HYSTEROSCOPY WITH MYOSURE RESECTION OF FIBROID;  Surgeon: Schermerhorn, Gwen Her, MD;  Location: [facility] ORS;  Service: Gynecology;  Laterality: N/A;  . DILATION AND CURETTAGE OF UTERUS    . FOOT SURGERY Right 2011  . HYSTEROSCOPY WITH NOVASURE N/A 06/09/2017   Procedure: HYSTEROSCOPY WITH NOVASURE;  Surgeon: Schermerhorn, Gwen Her, MD;  Location: [facility] ORS;  Service: Gynecology;  Laterality: N/A;  . lapband    . TONSILLECTOMY  1977    Family History  Problem Relation Age of Onset  . Diabetes Mother   . Hypertension Mother   . Hypertension Father   . Breast cancer  Neg Hx     Social History   Socioeconomic History  . Marital status: Single    Spouse name: Not on file  . Number of children: 1  . Years of education: Not on file  . Highest education level: Not on file  Occupational History  . Occupation: Facilities manager: [employer]  . Financial resource strain: Not hard at all  . Food insecurity:    Worry: Never true    Inability: Never true  . Transportation needs:    Medical: No    Non-medical: No  Tobacco Use  . Smoking status: Never Smoker  . Smokeless tobacco: Never Used  Substance and Sexual Activity  . Alcohol use: Yes    Alcohol/week: 0.0 standard drinks    Comment: OCC  . Drug use: No  . Sexual activity: Yes    Partners: Male  Lifestyle  . Physical activity:    Days per week: 2 days    Minutes per session: 60 min  . Stress: Very much  Relationships  . Social connections:    Talks on phone: More than three times a week    Gets together: More than three times a week    Attends religious service: More than 4 times per year    Active member of club or organization: Yes    Attends meetings of clubs or organizations: More  than 4 times per year    Relationship status: Not on file  . Intimate partner violence:    Fear of current or ex partner: No    Emotionally abused: No    Physically abused: No    Forced sexual activity: No  Other Topics Concern  . Not on file  Social History Narrative  . Not on file    No current outpatient medications on file.  Allergies  Allergen Reactions  . Other     BAND-AIDS IRRITATE SKIN-PT PREFERS TAPE    I personally reviewed active problem list, medication list, allergies, health maintenance, notes from last encounter, lab results with the patient/caregiver today.   ROS Constitutional: Negative for fever or weight change.  Respiratory: Negative for cough and shortness of breath.   Cardiovascular: Negative for chest pain or palpitations.  Gastrointestinal:  Negative for abdominal pain, no bowel changes.  Musculoskeletal: Negative for gait problem or joint swelling.  Skin: Negative for rash.  Neurological: Negative for dizziness or headache.  No other specific complaints in a complete review of systems (except as listed in HPI above).   Objective  Vitals:   08/13/18 1319  BP: 128/70  Pulse: 85  Resp: 16  Temp: 98.1 F (36.7 C)  TempSrc: Oral  SpO2: 99%  Weight: 228 lb (103.4 kg)  Height: 5\' 5"  (1.651 m)     Body mass index is 37.94 kg/m.  Physical Exam Constitutional: Patient appears well-developed and well-nourished. No distress.  HENT: Head: Normocephalic and atraumatic.Eyes: Conjunctivae and EOM are normal. No scleral icterus.  Neck: Normal range of motion. Neck supple. No JVD present. No thyromegaly present.  Cardiovascular: Normal rate, regular rhythm and normal heart sounds.  No murmur heard. No BLE edema. Pulmonary/Chest: Effort normal and breath sounds normal. No respiratory distress. Musculoskeletal: Normal range of motion, no joint effusions. No gross deformities Neurological: he is alert and oriented to person, place, and time. No cranial nerve deficit. Coordination, balance, strength, speech and gait are normal.  Skin: Skin is warm and dry. No rash noted. No erythema.  Psychiatric: Patient has a normal mood and affect. behavior is normal. Judgment and thought content normal.  No results found for this or any previous visit (from the past 72 hour(s)).  PHQ2/9: Depression screen [facility] 2/9 08/13/2018 06/04/2018 06/04/2018 09/09/2017 06/10/2016  Decreased Interest 0 0 0 0 0  Down, Depressed, Hopeless 0 0 0 0 0  PHQ - 2 Score 0 0 0 0 0  Altered sleeping 0 1 - - -  Tired, decreased energy 0 1 - - -  Change in appetite 0 0 - - -  Feeling bad or failure about yourself  0 0 - - -  Trouble concentrating 0 0 - - -  Moving slowly or fidgety/restless 0 0 - - -  Suicidal thoughts 0 0 - - -  PHQ-9 Score 0 2 - - -  Difficult  doing work/chores Not difficult at all Not difficult at all - - -   Fall Risk: Fall Risk  08/13/2018 06/04/2018 09/09/2017 06/10/2016 10/26/2015  Falls in the past year? 0 No No No No  Number falls in past yr: 0 - - - -  Injury with Fall? 0 - - - -    Assessment & Plan  1. Obesity (BMI 35.0-39.9 without comorbidity) - Lorcaserin HCl 10 MG TABS; Take 10 mg by mouth 2 (two) times daily.  Dispense: 60 tablet; Refill: 2 - Discussed importance of 150 minutes of physical activity  weekly, eat two servings of fish weekly, eat one serving of tree nuts ( cashews, pistachios, pecans, almonds.Marland Kitchen) every other day, eat 6 servings of fruit/vegetables daily and drink plenty of water and avoid sweet beverages.  - Encouraged nutrition referral, declines at this time.  2. Abnormal TSH - Returned to normal; will recheck at next visit in 3-4 months to ensure level is stable.

## 2018-08-17 ENCOUNTER — Telehealth: Payer: Self-pay | Admitting: Family Medicine

## 2018-08-17 DIAGNOSIS — E669 Obesity, unspecified: Secondary | ICD-10-CM

## 2018-08-17 MED ORDER — NALTREXONE-BUPROPION HCL ER 8-90 MG PO TB12: ORAL_TABLET | ORAL | 1 refills | Status: DC

## 2018-08-17 NOTE — Telephone Encounter (Signed)
Received denial for Belviq - states must fail to lose >/= 5% of body weight ( 11.4 lbs) in 16 weeks of contrave therapy.  I am sending in contrave Rx. She should keep her follow up with me in March, and we will re-evaluate at that time.  Please advise contrave dosing - take 1 tablet each morning x7 days, then increase to twice daily dosing.

## 2018-08-18 NOTE — Telephone Encounter (Signed)
Patient notified of change in medication

## 2018-09-22 IMAGING — MG MM DIGITAL SCREENING BILAT W/ TOMO W/ CAD
8 of 12 series · 8 of 28 positions shown · non-contrast
Comparison: Previous exam(s).

CLINICAL DATA: Screening.

EXAM:
2D DIGITAL SCREENING BILATERAL MAMMOGRAM WITH CAD AND ADJUNCT TOMO

[L MLO]
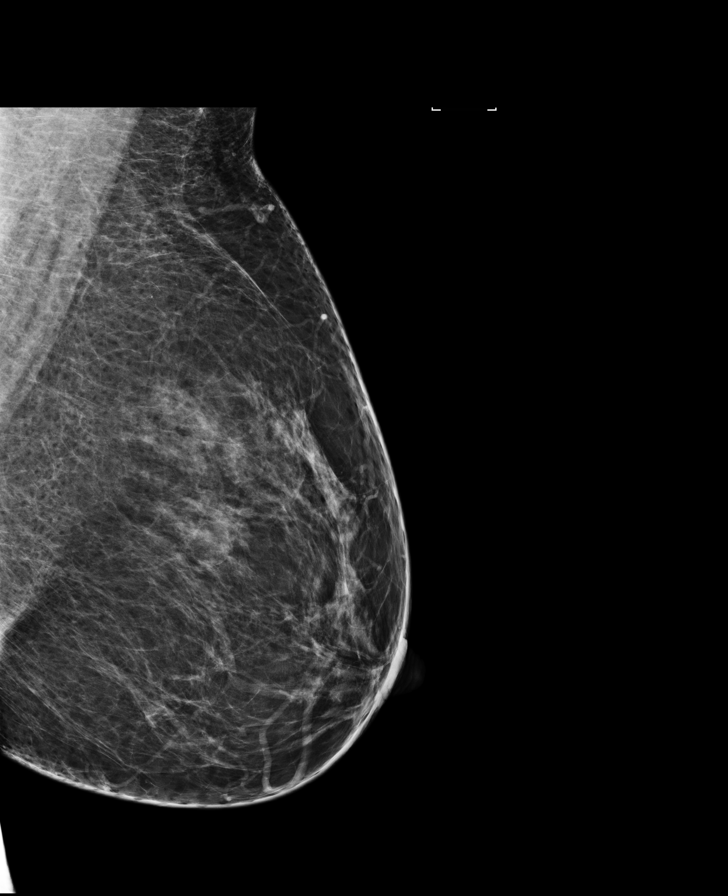

[R MLO]
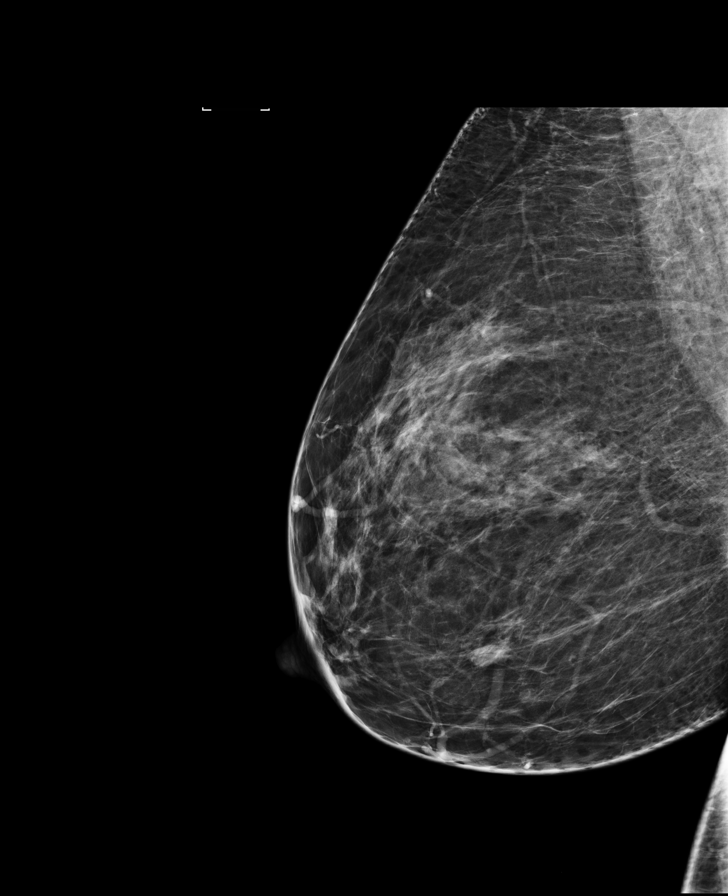

[L MLO synth-2D]
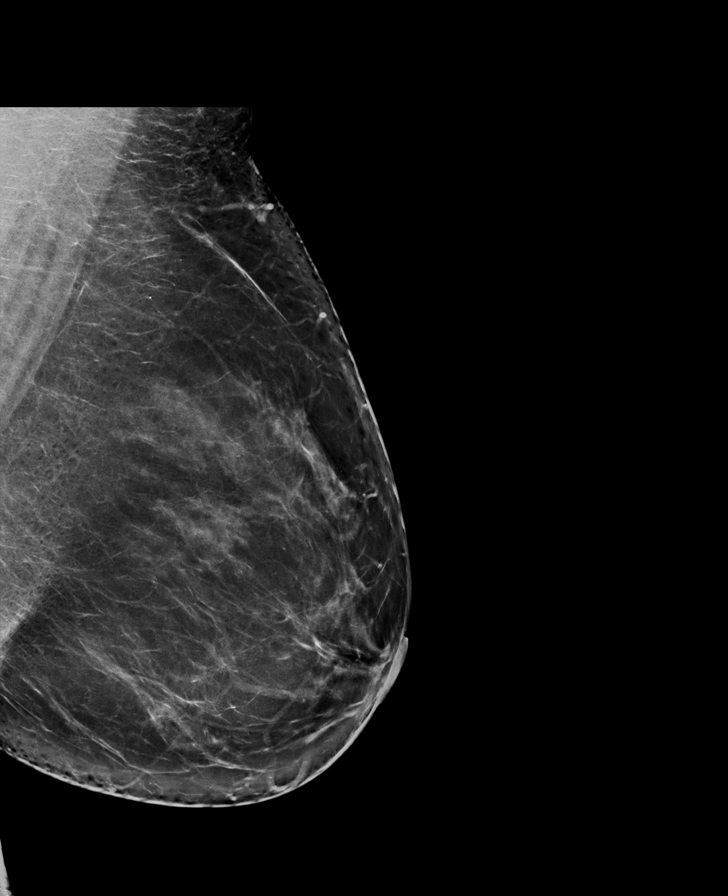

[R CC]
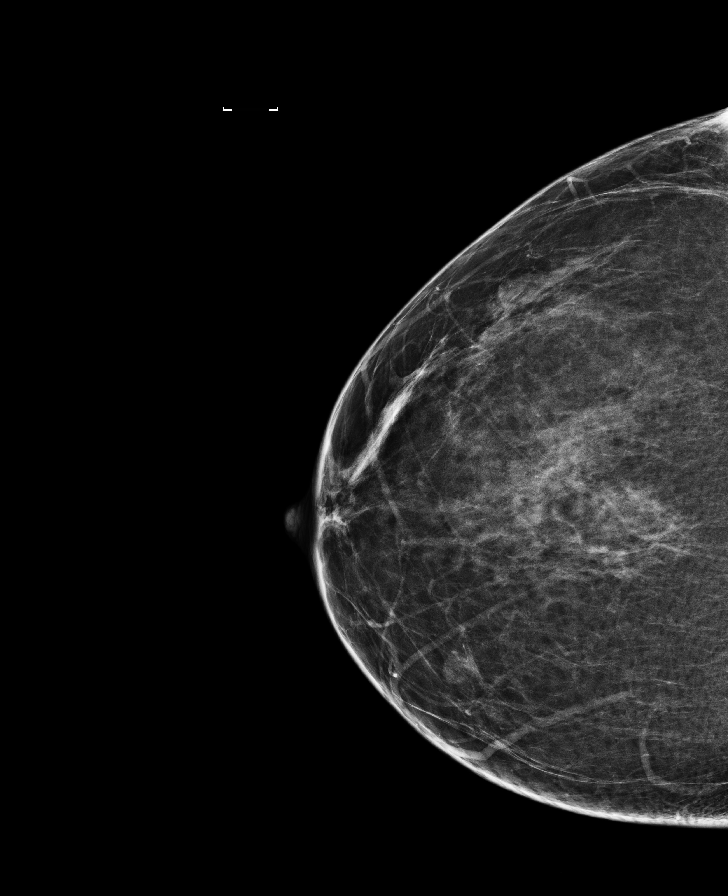

[R MLO synth-2D]
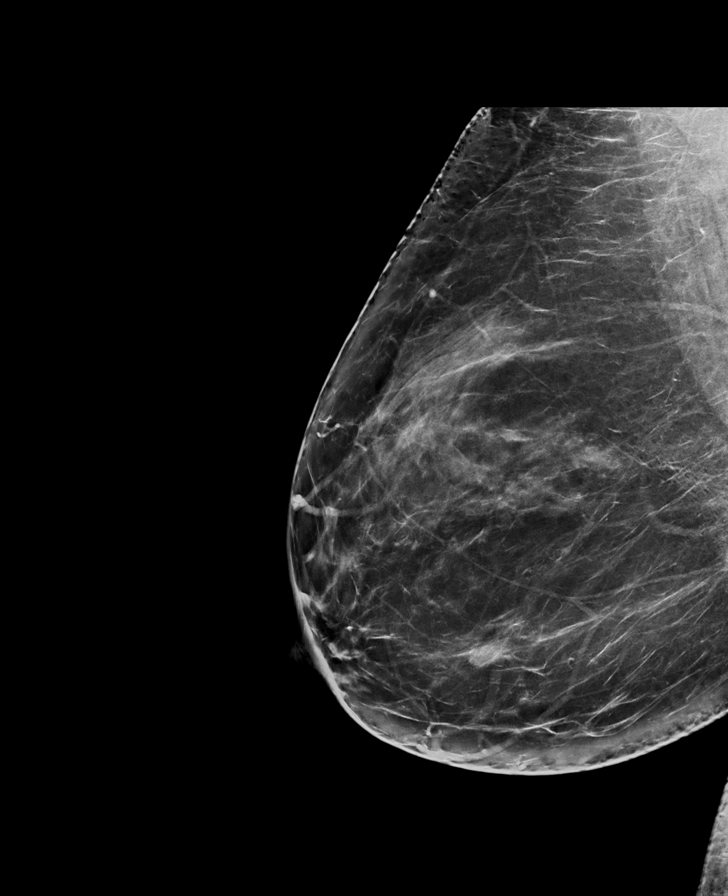

[L CC synth-2D]
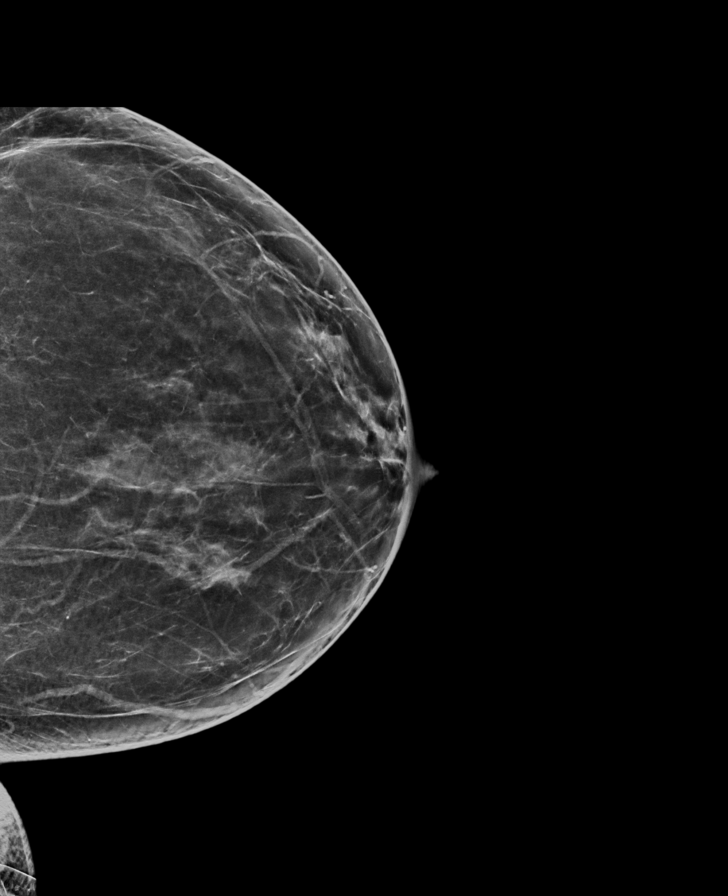

[R CC synth-2D]
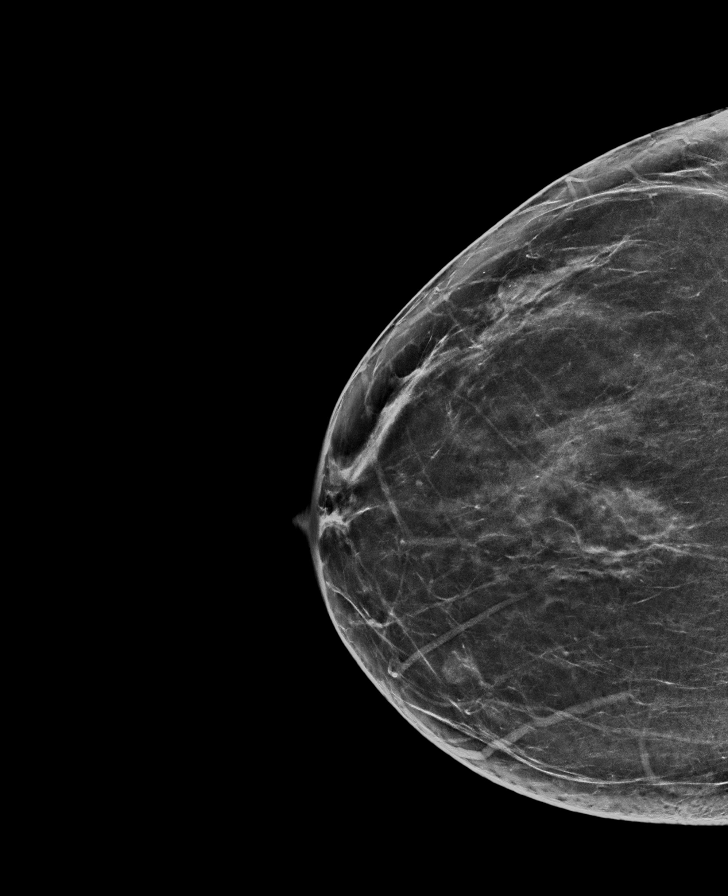

[L CC]
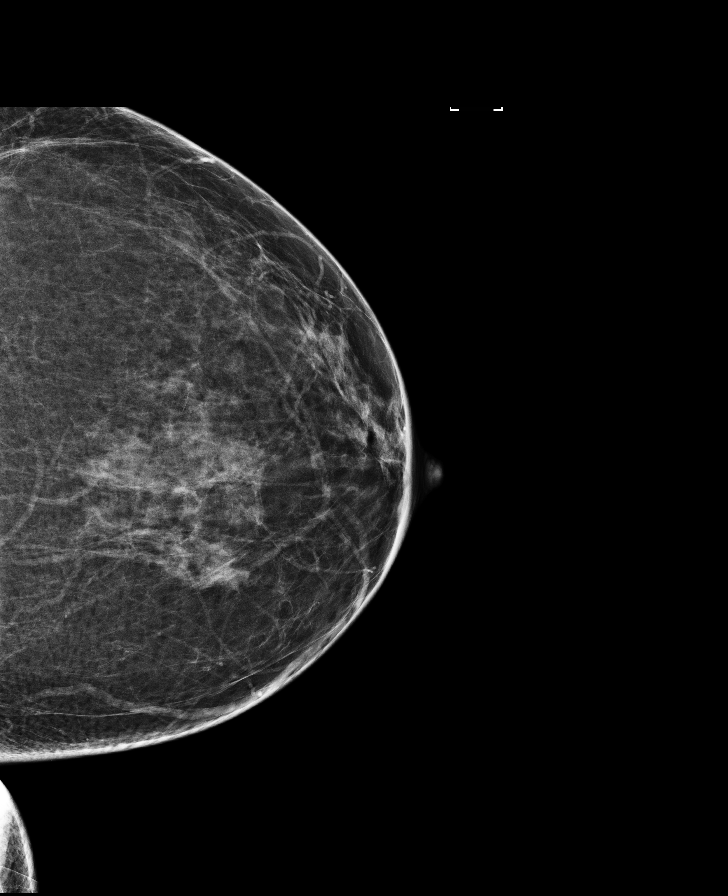

[8 of 28 positions shown; findings below may reference images not displayed]

ACR Breast Density Category b: There are scattered areas of
fibroglandular density.
FINDINGS: There are no findings suspicious for malignancy. Images were
processed with CAD.
IMPRESSION: No mammographic evidence of malignancy. A result letter of this
screening mammogram will be mailed directly to the patient.

RECOMMENDATION:
Screening mammogram in one year. (Code:97-6-RS4)

BI-RADS CATEGORY  1: Negative.

## 2018-12-10 ENCOUNTER — Encounter: Payer: Managed Care, Other (non HMO) | Admitting: Surgery

## 2018-12-10 ENCOUNTER — Other Ambulatory Visit: Payer: Self-pay

## 2018-12-10 ENCOUNTER — Ambulatory Visit: Payer: Managed Care, Other (non HMO) | Admitting: Family Medicine

## 2018-12-10 ENCOUNTER — Encounter: Payer: Self-pay | Admitting: Family Medicine

## 2018-12-10 ENCOUNTER — Encounter: Payer: Self-pay | Admitting: Surgery

## 2018-12-10 VITALS — BP 126/84 | HR 92 | Temp 98.2°F | Resp 16 | Ht 65.0 in | Wt 226.3 lb

## 2018-12-10 DIAGNOSIS — S338XXA Sprain of other parts of lumbar spine and pelvis, initial encounter: Secondary | ICD-10-CM

## 2018-12-10 DIAGNOSIS — M25552 Pain in left hip: Secondary | ICD-10-CM | POA: Diagnosis not present

## 2018-12-10 DIAGNOSIS — M7989 Other specified soft tissue disorders: Secondary | ICD-10-CM | POA: Diagnosis not present

## 2018-12-10 MED ORDER — MELOXICAM 15 MG PO TABS: 15.0000 mg | ORAL_TABLET | Freq: Every day | ORAL | 0 refills | Status: DC

## 2018-12-10 NOTE — Progress Notes (Signed)
Acute Office Visit  Subjective:    Patient ID: Kristine Mcconnell, female    DOB: 1965-12-14, 53 y.o.   MRN: [id number]  Chief Complaint  Patient presents with  . Arm Pain    swelling under tight arm pit, tender for 2 weeks  . Hip Pain    left hip and tail bone pain on and off     HPI Patient is in today for swelling to the Right axilla for the past two weeks and left hip and lower back pain for the past 4 months.   Right axillary swelling: She reports swelling and tenderness under her right arm for the past 2 weeks. She states that the area started small and has since become larger. She denies any associated fevers, chills, cough, sore throat, abdominal pain, nausea vomiting or diarrhea. She has not had any recent arm injuries and has not noticed any rashes around the lesion or any lumps in her breasts. She reports history of similar swelling sometimes around her menstrual cycles but states the swelling is typically much smaller and it decreases after a few days. She has not taken anything for the pain or swelling.    Left hip/lower back pain: reports bilateral hip pain for the past 4 months without any known injury or trauma. She denies back pain, radiation of pain or numbness, tingling or weakness. She states that pain increases if she lies on her Left side or if she sits for a long period of time and then gets up to walk. She describes the pain as a soreness that comes and goes. She does do belly dancing as part of her exercise routine but states that this does not seem to aggravate her pain. She has not taken any medications for her pain.  No bowel/bladder incontinence.   Past Medical History:  Diagnosis Date  . Anemia     Past Surgical History:  Procedure Laterality Date  . BREAST CYST ASPIRATION Left 1990   benign  . COLONOSCOPY WITH PROPOFOL N/A 08/03/2018   Procedure: COLONOSCOPY WITH PROPOFOL;  Surgeon: Lin Landsman, MD;  Location: [facility] ENDOSCOPY;  Service:  Gastroenterology;  Laterality: N/A;  . DILATATION & CURETTAGE/HYSTEROSCOPY WITH MYOSURE N/A 06/09/2017   Procedure: DILATATION & CURETTAGE/HYSTEROSCOPY WITH MYOSURE RESECTION OF FIBROID;  Surgeon: Schermerhorn, Gwen Her, MD;  Location: [facility] ORS;  Service: Gynecology;  Laterality: N/A;  . DILATION AND CURETTAGE OF UTERUS    . FOOT SURGERY Right 2011  . HYSTEROSCOPY WITH NOVASURE N/A 06/09/2017   Procedure: HYSTEROSCOPY WITH NOVASURE;  Surgeon: Schermerhorn, Gwen Her, MD;  Location: [facility] ORS;  Service: Gynecology;  Laterality: N/A;  . lapband    . TONSILLECTOMY  1977    Family History  Problem Relation Age of Onset  . Diabetes Mother   . Hypertension Mother   . Hypertension Father   . Breast cancer Neg Hx   . Colon cancer Neg Hx     Social History   Socioeconomic History  . Marital status: Single    Spouse name: Not on file  . Number of children: 1  . Years of education: Not on file  . Highest education level: Not on file  Occupational History  . Occupation: Facilities manager: [employer]  . Financial resource strain: Not hard at all  . Food insecurity:    Worry: Never true    Inability: Never true  . Transportation needs:    Medical: No    Non-medical:  No  Tobacco Use  . Smoking status: Never Smoker  . Smokeless tobacco: Never Used  Substance and Sexual Activity  . Alcohol use: Yes    Alcohol/week: 0.0 standard drinks    Comment: OCC  . Drug use: No  . Sexual activity: Yes    Partners: Male  Lifestyle  . Physical activity:    Days per week: 2 days    Minutes per session: 60 min  . Stress: Very much  Relationships  . Social connections:    Talks on phone: More than three times a week    Gets together: More than three times a week    Attends religious service: More than 4 times per year    Active member of club or organization: Yes    Attends meetings of clubs or organizations: More than 4 times per year    Relationship status: Not on file   . Intimate partner violence:    Fear of current or ex partner: No    Emotionally abused: No    Physically abused: No    Forced sexual activity: No  Other Topics Concern  . Not on file  Social History Narrative  . Not on file    Outpatient Medications Prior to Visit  Medication Sig Dispense Refill  . Lorcaserin HCl 10 MG TABS Take 10 mg by mouth 2 (two) times daily. (Patient not taking: Reported on 12/10/2018) 60 tablet 2  . Naltrexone-buPROPion HCl ER 8-90 MG TB12 Take 1 tablet once daily in the morning x7 days, then increase to 1 tablet twice daily (Patient not taking: Reported on 12/10/2018) 120 tablet 1   No facility-administered medications prior to visit.     Allergies  Allergen Reactions  . Other     BAND-AIDS IRRITATE SKIN-PT PREFERS TAPE    Review of Systems  Constitutional: Negative.   HENT: Negative.   Eyes: Negative.   Respiratory: Negative.   Cardiovascular: Negative.   Gastrointestinal: Negative.   Genitourinary: Negative.   Musculoskeletal: Positive for back pain and joint pain.  Skin:       Swelling and tenderness to right axilla  Neurological: Negative for tingling and weakness.  Endo/Heme/Allergies: Negative.   Psychiatric/Behavioral: Negative.   All other systems reviewed and are negative.     Objective:    Physical Exam  Constitutional: She is oriented to person, place, and time. She appears well-developed and well-nourished.  HENT:  Head: Normocephalic and atraumatic.  Eyes: Pupils are equal, round, and reactive to light.  Neck: Normal range of motion. Neck supple.  Cardiovascular: Normal rate, regular rhythm and normal heart sounds.  Pulmonary/Chest: Effort normal and breath sounds normal.  Abdominal: Soft. Bowel sounds are normal.  Musculoskeletal: Normal range of motion.     Left hip: She exhibits bony tenderness. She exhibits normal range of motion, normal strength and no deformity.     Lumbar back: She exhibits pain. She exhibits normal  range of motion, no deformity and no spasm.  Lymphadenopathy:    She has no cervical adenopathy.  Neurological: She is alert and oriented to person, place, and time. She has normal reflexes.  Skin: Skin is warm and dry.  Firm, moveable, tender mass to right axilla, approx 5cm in diameter without drainage or erythema  Psychiatric: She has a normal mood and affect. Her behavior is normal. Thought content normal.  Nursing note and vitals reviewed.  BP 126/84 (BP Location: Right Arm, Patient Position: Sitting, Cuff Size: Large)   Pulse 92  Temp 98.2 F (36.8 C) (Oral)   Resp 16   Ht 5\' 5"  (1.651 m)   Wt 226 lb 4.8 oz (102.6 kg)   SpO2 94%   BMI 37.66 kg/m  Wt Readings from Last 3 Encounters:  12/10/18 226 lb (102.5 kg)  12/10/18 226 lb 4.8 oz (102.6 kg)  08/13/18 228 lb (103.4 kg)    There are no preventive care reminders to display for this patient.  There are no preventive care reminders to display for this patient.   Lab Results  Component Value Date   TSH 2.220 07/30/2018   Lab Results  Component Value Date   WBC 4.5 06/09/2018   HGB 11.8 06/09/2018   HCT 36.5 06/09/2018   MCV 80 06/09/2018   PLT 326 06/09/2018   Lab Results  Component Value Date   NA 140 06/09/2018   K 4.3 06/09/2018   CO2 24 06/09/2018   GLUCOSE 108 (H) 06/09/2018   BUN 8 06/09/2018   CREATININE 0.70 06/09/2018   BILITOT 0.4 06/09/2018   ALKPHOS 97 06/09/2018   AST 15 06/09/2018   ALT 14 06/09/2018   PROT 6.4 06/09/2018   ALBUMIN 4.2 06/09/2018   CALCIUM 9.2 06/09/2018   Lab Results  Component Value Date   CHOL 157 06/09/2018   Lab Results  Component Value Date   HDL 57 06/09/2018   Lab Results  Component Value Date   LDLCALC 86 06/09/2018   Lab Results  Component Value Date   TRIG 69 06/09/2018   Lab Results  Component Value Date   CHOLHDL 2.8 06/09/2018   No results found for: HGBA1C     Assessment & Plan:   Problem List Items Addressed This Visit    None     Visit Diagnoses    Right axillary swelling    -  Primary   Relevant Medications   meloxicam (MOBIC) 15 MG tablet   Other Relevant Orders   Ambulatory referral to General Surgery   Left hip pain       Relevant Medications   meloxicam (MOBIC) 15 MG tablet   Coccyx sprain, initial encounter       Relevant Medications   meloxicam (MOBIC) 15 MG tablet    Right axillary swelling: suspect abscess vs. Lymphadenopathy due to nature of mass, we will refer to surgery for further evaluation.  Meds ordered this encounter  Medications  . meloxicam (MOBIC) 15 MG tablet    Sig: Take 1 tablet (15 mg total) by mouth daily.    Dispense:  30 tablet    Refill:  0    Order Specific Question:   Supervising Provider    Answer:   Steele Sizer [[id number]]    Hubbard Hartshorn, FNP

## 2018-12-10 NOTE — Patient Instructions (Signed)
error 

## 2018-12-10 NOTE — Progress Notes (Signed)
  This encounter was created in error - please disregard. Patient had another appointment and could not wait.

## 2018-12-15 ENCOUNTER — Ambulatory Visit: Payer: Managed Care, Other (non HMO) | Admitting: Family Medicine

## 2018-12-17 ENCOUNTER — Ambulatory Visit (INDEPENDENT_AMBULATORY_CARE_PROVIDER_SITE_OTHER): Payer: Managed Care, Other (non HMO) | Admitting: Surgery

## 2018-12-17 ENCOUNTER — Other Ambulatory Visit: Payer: Self-pay

## 2018-12-17 ENCOUNTER — Encounter: Payer: Self-pay | Admitting: Surgery

## 2018-12-17 VITALS — BP 148/90 | HR 71 | Temp 97.7°F | Resp 14 | Ht 65.0 in | Wt 226.0 lb

## 2018-12-17 DIAGNOSIS — L723 Sebaceous cyst: Secondary | ICD-10-CM | POA: Diagnosis not present

## 2018-12-17 NOTE — Progress Notes (Signed)
Surgical Clinic History and Physical  Referring provider:  Hubbard Hartshorn, FNP [address] [place],  [address]  HISTORY OF PRESENT ILLNESS (HPI):  53 y.o. female presents for evaluation of her Right axillary pain. Patient reports she has experienced a variably painful Right axillary mass/swelling x several years, which becomes increasingly painful when it enlarges and the less painful when it regresses. She says recently it became larger and remained large and painful, though has again since began to again regress. It occasionally drains white material, though she does not recall it having become red and denies any fever. Patient adds she has had similar excised from between her breasts and has been told she has another subcutaneous cyst of her lower posterior neck, though she denies any posterior neck pain and denies any Left axillary pain.  PAST MEDICAL HISTORY (PMH):  Past Medical History:  Diagnosis Date  . Anemia     PAST SURGICAL HISTORY ([place]):  Past Surgical History:  Procedure Laterality Date  . BREAST CYST ASPIRATION Left 1990   benign  . COLONOSCOPY WITH PROPOFOL N/A 08/03/2018   Procedure: COLONOSCOPY WITH PROPOFOL;  Surgeon: Lin Landsman, MD;  Location: [facility] ENDOSCOPY;  Service: Gastroenterology;  Laterality: N/A;  . DILATATION & CURETTAGE/HYSTEROSCOPY WITH MYOSURE N/A 06/09/2017   Procedure: DILATATION & CURETTAGE/HYSTEROSCOPY WITH MYOSURE RESECTION OF FIBROID;  Surgeon: Schermerhorn, Gwen Her, MD;  Location: [facility] ORS;  Service: Gynecology;  Laterality: N/A;  . DILATION AND CURETTAGE OF UTERUS    . FOOT SURGERY Right 2011  . HYSTEROSCOPY WITH NOVASURE N/A 06/09/2017   Procedure: HYSTEROSCOPY WITH NOVASURE;  Surgeon: Schermerhorn, Gwen Her, MD;  Location: [facility] ORS;  Service: Gynecology;  Laterality: N/A;  . lapband  more than 10 years ago  . TONSILLECTOMY  1977    MEDICATIONS:  Prior to Admission medications   Medication Sig Start Date End Date  Taking? Authorizing Provider  Lorcaserin HCl 10 MG TABS Take 10 mg by mouth 2 (two) times daily. Patient not taking: Reported on 12/10/2018 08/13/18   Hubbard Hartshorn, FNP  meloxicam (MOBIC) 15 MG tablet Take 1 tablet (15 mg total) by mouth daily. 12/10/18   Hubbard Hartshorn, FNP  Naltrexone-buPROPion HCl ER 8-90 MG TB12 Take 1 tablet once daily in the morning x7 days, then increase to 1 tablet twice daily Patient not taking: Reported on 12/10/2018 08/17/18   Hubbard Hartshorn, FNP    ALLERGIES:  Allergies  Allergen Reactions  . Other     BAND-AIDS IRRITATE SKIN-PT PREFERS TAPE    SOCIAL HISTORY:  Social History   Socioeconomic History  . Marital status: Single    Spouse name: Not on file  . Number of children: 1  . Years of education: Not on file  . Highest education level: Not on file  Occupational History  . Occupation: Facilities manager: [employer]  . Financial resource strain: Not hard at all  . Food insecurity:    Worry: Never true    Inability: Never true  . Transportation needs:    Medical: No    Non-medical: No  Tobacco Use  . Smoking status: Never Smoker  . Smokeless tobacco: Never Used  Substance and Sexual Activity  . Alcohol use: Yes    Alcohol/week: 0.0 standard drinks    Comment: OCC  . Drug use: No  . Sexual activity: Yes    Partners: Male  Lifestyle  . Physical activity:    Days per  week: 2 days    Minutes per session: 60 min  . Stress: Very much  Relationships  . Social connections:    Talks on phone: More than three times a week    Gets together: More than three times a week    Attends religious service: More than 4 times per year    Active member of club or organization: Yes    Attends meetings of clubs or organizations: More than 4 times per year    Relationship status: Not on file  . Intimate partner violence:    Fear of current or ex partner: No    Emotionally abused: No    Physically abused: No    Forced sexual activity:  No  Other Topics Concern  . Not on file  Social History Narrative  . Not on file    The patient currently resides (home / rehab facility / nursing home): Home The patient normally is (ambulatory / bedbound): Ambulatory  FAMILY HISTORY:  Family History  Problem Relation Age of Onset  . Diabetes Mother   . Hypertension Mother   . Hypertension Father   . Breast cancer Neg Hx   . Colon cancer Neg Hx     Otherwise negative/non-contributory.  REVIEW OF SYSTEMS:  Constitutional: denies any other weight loss, fever, chills, or sweats  Eyes: denies any other vision changes, history of eye injury  ENT: denies sore throat, hearing problems  Respiratory: denies shortness of breath, wheezing  Cardiovascular: denies chest pain, palpitations  Gastrointestinal: denies abdominal pain, N/V, or diarrhea Musculoskeletal: denies any other joint pains or cramps  Skin: Denies any other rashes or skin discolorations except as per HPI Neurological: denies any other headache, dizziness, weakness  Psychiatric: Denies any other depression, anxiety   All other review of systems were otherwise negative   VITAL SIGNS:  BP (!) 148/90   Pulse 71   Temp 97.7 F (36.5 C)   Resp 14   Ht 5\' 5"  (1.651 m)   Wt 226 lb (102.5 kg)   LMP 09/09/2018 (Approximate)   SpO2 97%   BMI 37.61 kg/m   PHYSICAL EXAM:  Constitutional:  -- Obese body habitus  -- Awake, alert, and oriented x3  Eyes:  -- Pupils equally round and reactive to light  -- No scleral icterus  Ear, nose, throat:  -- No jugular venous distension -- No nasal drainage, bleeding Pulmonary:  -- No crackles  -- Equal breath sounds bilaterally -- Breathing non-labored at rest Cardiovascular:  -- S1, S2 present  -- No pericardial rubs  Gastrointestinal:  -- Abdomen soft, non-tender to palpation, non-distended, no guarding/rebound tenderness -- No abdominal masses appreciated, pulsatile or otherwise  Musculoskeletal and Integumentary:  --  Wounds or skin discoloration: 2.5 cm moderately tender to palpation single discrete mobile round subcutaneous mass with a small amount of white drainage from a punctate central pore without surrounding erythema; no similar Left axillary or posterior neck lesions -- Extremities: B/L UE and LE FROM, hands and feet warm  Neurologic:  -- Motor function: Intact and symmetric -- Sensation: Intact and symmetric  Labs:  CBC:  Lab Results  Component Value Date   WBC 4.5 06/09/2018   RBC 4.59 06/09/2018   BMP:  Lab Results  Component Value Date   GLUCOSE 108 (H) 06/09/2018   CO2 24 06/09/2018   BUN 8 06/09/2018   CREATININE 0.70 06/09/2018   CALCIUM 9.2 06/09/2018    Imaging studies: No new pertinent imaging studies available for review  at this time   Assessment/Plan:  53 y.o. female with an enlarged and increasingly symptomatic discreet focal 2.5 cm moderately tender to palpation single discrete mobile round subcutaneous mass with a small amount of white drainage from a punctate central pore without surrounding erythema, most consistent with a currently non-infected Right axillary sebaceous/epidermoid cyst (hidradenitis and abscess less likely), complicated by co-morbidities including morbid obesity (BMI 38).   - differential diagnoses and natural history of sebaceous cyst discussed   - all risks, benefits, and alternatives to excision of increasingly symptomatic and enlarged Right axillary subcutaneous mass (most likely consistent with a non-infected sebaceous/epidermoid cyst) were discussed with the patient, all of her questions were answered to her expressed satisfaction, patient expresses she wishes to proceed, and informed consent was obtained.  - will ask office to call to schedule procedure when able to again schedule elective surgeries (currently held due to Covid-19 coronavirus outbreak)  - if accessible and acceptable for patient, would be appropriate for outpatient surgery center due  to size and location of subcutaneous mass  - also discussed with patient signs/symptoms of infection, for which patient instructed to call office  - anticipate return to clinic 2 weeks following above elected procedure  - instructed to call if any questions or concerns  All of the above recommendations were discussed with the patient, and all of patient's questions were answered to her expressed satisfaction.  Thank you for the opportunity to participate in this patient's care.  -- Marilynne Drivers [name], MD, [place]: [facility] - Partnering for exceptional care. Office: [phone]

## 2018-12-17 NOTE — Patient Instructions (Signed)
We will call you to schedule surgery once restrictions are lifted. Please call the office with any worsening symptoms or questions.    You have been seen in our office today and diagnosed with a Sebaceous Cyst.  Most of the time, these can be removed in the office but we have to completely remove this cyst when the cyst is not inflamed.  Please call our office with any questions or concerns and speak with a nurse.  Epidermal (Sebaceous) Cyst An epidermal cyst is sometimes called an epidermal inclusion cyst or an infundibular cyst. It is a sac made of skin tissue. The sac contains a substance called keratin. Keratin is a protein that is normally secreted through the hair follicles. When keratin becomes trapped in the top layer of skin (epidermis), it can form an epidermal cyst. Epidermal cysts are usually found on the face, neck, trunk, and genitals. These cysts are usually harmless (benign), and they may not cause symptoms unless they become infected. It is important not to pop epidermal cysts yourself. What are the causes? This condition may be caused by:  A blocked hair follicle.  A hair that curls and re-enters the skin instead of growing straight out of the skin (ingrown hair).  A blocked pore.  Irritated skin.  An injury to the skin.  Certain conditions that are passed along from parent to child (inherited).  Human papillomavirus (HPV).  What increases the risk? The following factors may make you more likely to develop an epidermal cyst:  Having acne.  Being overweight.  Wearing tight clothing.  What are the signs or symptoms? The only symptom of this condition may be a small, painless lump underneath the skin. When an epidermal cyst becomes infected, symptoms may include:  Redness.  Inflammation.  Tenderness.  Warmth.  Fever.  Keratin draining from the cyst. Keratin may look like a grayish-white, bad-smelling substance.  Pus draining from the cyst.  How  is this diagnosed? This condition is diagnosed with a physical exam. In some cases, you may have a sample of tissue (biopsy) taken from your cyst to be examined under a microscope or tested for bacteria. You may be referred to a health care provider who specializes in skin care (dermatologist). How is this treated? In many cases, epidermal cysts go away on their own without treatment. If a cyst becomes infected, treatment may include:  Opening and draining the cyst. After draining, minor surgery to remove the rest of the cyst may be done.  Antibiotic medicine to help prevent infection.  Injections of medicines (steroids) that help to reduce inflammation.  Surgery to remove the cyst. Surgery may be done if: ? The cyst becomes large. ? The cyst bothers you. ? There is a chance that the cyst could turn into cancer.  Follow these instructions at home:  Take over-the-counter and prescription medicines only as told by your health care provider.  If you were prescribed an antibiotic, use it as told by your health care provider. Do not stop using the antibiotic even if you start to feel better.  Keep the area around your cyst clean and dry.  Wear loose, dry clothing.  Do not try to pop your cyst.  Avoid touching your cyst.  Check your cyst every day for signs of infection.  Keep all follow-up visits as told by your health care provider. This is important. How is this prevented?  Wear clean, dry, clothing.  Avoid wearing tight clothing.  Keep your skin clean and  dry. Shower or take baths every day.  Wash your body with a benzoyl peroxide wash when you shower or bathe. Contact a health care provider if:  Your cyst develops symptoms of infection.  Your condition is not improving or is getting worse.  You develop a cyst that looks different from other cysts you have had.  You have a fever. Get help right away if:  Redness spreads from the cyst into the surrounding area. This  information is not intended to replace advice given to you by your health care provider. Make sure you discuss any questions you have with your health care provider. Document Released: 08/10/2004 Document Revised: 05/08/2016 Document Reviewed: 07/12/2015 Elsevier Interactive Patient Education  [employer].

## 2019-01-12 ENCOUNTER — Other Ambulatory Visit: Payer: Self-pay | Admitting: Family Medicine

## 2019-01-12 DIAGNOSIS — M7989 Other specified soft tissue disorders: Secondary | ICD-10-CM

## 2019-01-12 DIAGNOSIS — M25552 Pain in left hip: Secondary | ICD-10-CM

## 2019-01-12 DIAGNOSIS — S338XXA Sprain of other parts of lumbar spine and pelvis, initial encounter: Secondary | ICD-10-CM

## 2019-02-06 ENCOUNTER — Other Ambulatory Visit: Payer: Self-pay | Admitting: Family Medicine

## 2019-02-06 DIAGNOSIS — S338XXA Sprain of other parts of lumbar spine and pelvis, initial encounter: Secondary | ICD-10-CM

## 2019-02-06 DIAGNOSIS — M7989 Other specified soft tissue disorders: Secondary | ICD-10-CM

## 2019-02-06 DIAGNOSIS — M25552 Pain in left hip: Secondary | ICD-10-CM

## 2019-02-08 ENCOUNTER — Telehealth: Payer: Self-pay | Admitting: *Deleted

## 2019-02-08 NOTE — Telephone Encounter (Signed)
Patient's surgery to be scheduled for 03-17-19 at [facility] with Dr. [name].  The patient will need to be tested for COVID-19 on 03-12-19 at the [facility] building drive thru between 69:50 am and 12:30 pm  Patient aware to be NPO after midnight and have a driver.   Patient aware that she may have no visitors and driver will need to wait in the car due to COVID-19 restrictions.   The patient verbalizes understanding of the above.   The patient is aware to call the office should she have further questions.   Secure chat message sent to Dr. [name] to see if he has any special needs for the case and if patient needs a pre-op visit.

## 2019-02-09 ENCOUNTER — Telehealth: Payer: Self-pay | Admitting: *Deleted

## 2019-02-09 NOTE — Telephone Encounter (Signed)
Pre-op visit will be required per Dr. [name]. No special needs for the case to request from [facility].  An appointment has been scheduled for 03-11-19 at 9 am with Dr. [name].   Patient verbalizes understanding.

## 2019-03-10 ENCOUNTER — Encounter: Payer: Self-pay | Admitting: *Deleted

## 2019-03-10 ENCOUNTER — Other Ambulatory Visit: Payer: Self-pay

## 2019-03-11 ENCOUNTER — Ambulatory Visit: Payer: Managed Care, Other (non HMO) | Admitting: Surgery

## 2019-03-11 ENCOUNTER — Encounter: Payer: Self-pay | Admitting: Surgery

## 2019-03-11 ENCOUNTER — Other Ambulatory Visit: Payer: Self-pay

## 2019-03-11 VITALS — BP 159/89 | HR 68 | Temp 97.7°F | Resp 16 | Ht 65.0 in | Wt 230.4 lb

## 2019-03-11 DIAGNOSIS — L723 Sebaceous cyst: Secondary | ICD-10-CM | POA: Diagnosis not present

## 2019-03-11 NOTE — H&P (View-Only) (Signed)
Surgical Clinic Progress/Follow-up Note   HPI:  53 y.o. Female presents to clinic for follow-up evaluation of her Right axillary mass. Patient reports the lesion continues to drain, though with mild pain compared to more severe previously. She describes that the size of the lesion varies cyclically, sometimes larger and other times smaller (such as now) without any fever/chills. She again expresses her wishes to proceed with excision as well as questions regarding options for anesthesia. Patient otherwise denies any N/V, CP, or SOB.  Review of Systems:  Constitutional: denies any other weight loss, fever, chills, or sweats  Eyes: denies any other vision changes, history of eye injury  ENT: denies sore throat, hearing problems  Respiratory: denies shortness of breath, wheezing  Cardiovascular: denies chest pain, palpitations  Gastrointestinal: denies abdominal pain, N/V, or diarrhea Musculoskeletal: denies any other joint pains or cramps  Skin: Denies any other rashes or skin discolorations except as per interval history Neurological: denies any other headache, dizziness, weakness  Psychiatric: denies any other depression, anxiety  All other review of systems: otherwise negative   Vital Signs:  BP (!) 159/89   Pulse 68   Temp 97.7 F (36.5 C) (Temporal)   Resp 16   Ht 5\' 5"  (1.651 m)   Wt 230 lb 6.4 oz (104.5 kg)   LMP 02/15/2019 (Approximate)   SpO2 98%   BMI 38.34 kg/m    Physical Exam:  Constitutional:  -- Obese body habitus  -- Awake, alert, and oriented x3  Eyes:  -- Pupils equally round and reactive to light  -- No scleral icterus  Ear, nose, throat:  -- No jugular venous distension  -- No nasal drainage, bleeding Pulmonary:  -- No crackles -- Equal breath sounds bilaterally -- Breathing non-labored at rest Cardiovascular:  -- S1, S2 present  -- No pericardial rubs  Gastrointestinal:  -- Soft, nontender, non-distended, no guarding/rebound  -- No abdominal  masses appreciated, pulsatile or otherwise  Musculoskeletal / Integumentary:  -- Wounds or skin discoloration: Right axillary 3 cm x 1 cm subcutaneous lesion with creamy drainage from multiple pore sites, focally firm at its center, consistent with sebaceous cyst vs hidradenitis with scar s/p prior incision and drainage, currently without surrounding erythema or purulent drainage -- Extremities: B/L UE and LE FROM, hands and feet warm  Neurologic:  -- Motor function: intact and symmetric  -- Sensation: intact and symmetric   Laboratory studies:  CBC Latest Ref Rng & Units 06/09/2018 10/26/2015  WBC 3.4 - 10.8 x10E3/uL 4.5 4.4  Hemoglobin 11.1 - 15.9 g/dL 11.8 11.1  Hematocrit 34.0 - 46.6 % 36.5 36.1  Platelets 150 - 450 x10E3/uL 326 338   CMP Latest Ref Rng & Units 06/09/2018 10/26/2015  Glucose 65 - 99 mg/dL 108(H) 90  BUN 6 - 24 mg/dL 8 11  Creatinine 0.57 - 1.00 mg/dL 0.70 0.76  Sodium 134 - 144 mmol/L 140 141  Potassium 3.5 - 5.2 mmol/L 4.3 5.5(H)  Chloride 96 - 106 mmol/L 102 104  CO2 20 - 29 mmol/L 24 22  Calcium 8.7 - 10.2 mg/dL 9.2 9.3  Total Protein 6.0 - 8.5 g/dL 6.4 6.7  Total Bilirubin 0.0 - 1.2 mg/dL 0.4 0.7  Alkaline Phos 39 - 117 IU/L 97 76  AST 0 - 40 IU/L 15 9  ALT 0 - 32 IU/L 14 10   Imaging: No new pertinent imaging studies available for review at this time   Assessment:  53 y.o. yo Female with a problem list including.Marland KitchenMarland Kitchen  Patient Active Problem List   Diagnosis Date Noted  . 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 Obesity (BMI 35.0-39.9 without comorbidity) 06/10/2016  . Light headedness 10/26/2015  . Right ankle pain 10/26/2015    Assessment/Plan:  53 y.o. female with an enlarged and increasingly symptomatic discreet focal 2.5 cm moderately tender to palpation single discrete mobile round subcutaneous mass with a small amount of white drainage from a punctate central pore without surrounding erythema, most consistent with a currently non-infected Right axillary  sebaceous/epidermoid cyst (hidradenitis and abscess less likely), complicated by co-morbidities including morbid obesity (BMI 38).              - differential diagnoses and natural history of sebaceous cyst discussed              - all risks, benefits, and alternatives to excision of increasingly symptomatic and enlarged Right axillary subcutaneous mass (most likely consistent with a non-infected sebaceous/epidermoid cyst) were discussed with the patient, all of her questions were answered to her expressed satisfaction, patient expresses she wishes to proceed, and informed consent was obtained.  - will plan to proceed with excision of Right axillary subcutaneous mass pending anesthesia and OR availability  - anticipate return to clinic 2 weeks following above elective procedure(s)  - instructed to call office if any questions or concerns  All of the above recommendations were discussed with the patient, and all of patient's questions were answered to her expressed satisfaction.  -- Kristine Mcconnell [name], MD, [place]: [facility] - [facility] for exceptional care. Office: [phone]

## 2019-03-11 NOTE — Patient Instructions (Addendum)
Please call our office if you have questions or concerns.   

## 2019-03-11 NOTE — Progress Notes (Signed)
Surgical Clinic Progress/Follow-up Note   HPI:  53 y.o. Female presents to clinic for follow-up evaluation of her Right axillary mass. Patient reports the lesion continues to drain, though with mild pain compared to more severe previously. She describes that the size of the lesion varies cyclically, sometimes larger and other times smaller (such as now) without any fever/chills. She again expresses her wishes to proceed with excision as well as questions regarding options for anesthesia. Patient otherwise denies any N/V, CP, or SOB.  Review of Systems:  Constitutional: denies any other weight loss, fever, chills, or sweats  Eyes: denies any other vision changes, history of eye injury  ENT: denies sore throat, hearing problems  Respiratory: denies shortness of breath, wheezing  Cardiovascular: denies chest pain, palpitations  Gastrointestinal: denies abdominal pain, N/V, or diarrhea Musculoskeletal: denies any other joint pains or cramps  Skin: Denies any other rashes or skin discolorations except as per interval history Neurological: denies any other headache, dizziness, weakness  Psychiatric: denies any other depression, anxiety  All other review of systems: otherwise negative   Vital Signs:  BP (!) 159/89   Pulse 68   Temp 97.7 F (36.5 C) (Temporal)   Resp 16   Ht 5\' 5"  (1.651 m)   Wt 230 lb 6.4 oz (104.5 kg)   LMP 02/15/2019 (Approximate)   SpO2 98%   BMI 38.34 kg/m    Physical Exam:  Constitutional:  -- Obese body habitus  -- Awake, alert, and oriented x3  Eyes:  -- Pupils equally round and reactive to light  -- No scleral icterus  Ear, nose, throat:  -- No jugular venous distension  -- No nasal drainage, bleeding Pulmonary:  -- No crackles -- Equal breath sounds bilaterally -- Breathing non-labored at rest Cardiovascular:  -- S1, S2 present  -- No pericardial rubs  Gastrointestinal:  -- Soft, nontender, non-distended, no guarding/rebound  -- No abdominal  masses appreciated, pulsatile or otherwise  Musculoskeletal / Integumentary:  -- Wounds or skin discoloration: Right axillary 3 cm x 1 cm subcutaneous lesion with creamy drainage from multiple pore sites, focally firm at its center, consistent with sebaceous cyst vs hidradenitis with scar s/p prior incision and drainage, currently without surrounding erythema or purulent drainage -- Extremities: B/L UE and LE FROM, hands and feet warm  Neurologic:  -- Motor function: intact and symmetric  -- Sensation: intact and symmetric   Laboratory studies:  CBC Latest Ref Rng & Units 06/09/2018 10/26/2015  WBC 3.4 - 10.8 x10E3/uL 4.5 4.4  Hemoglobin 11.1 - 15.9 g/dL 11.8 11.1  Hematocrit 34.0 - 46.6 % 36.5 36.1  Platelets 150 - 450 x10E3/uL 326 338   CMP Latest Ref Rng & Units 06/09/2018 10/26/2015  Glucose 65 - 99 mg/dL 108(H) 90  BUN 6 - 24 mg/dL 8 11  Creatinine 0.57 - 1.00 mg/dL 0.70 0.76  Sodium 134 - 144 mmol/L 140 141  Potassium 3.5 - 5.2 mmol/L 4.3 5.5(H)  Chloride 96 - 106 mmol/L 102 104  CO2 20 - 29 mmol/L 24 22  Calcium 8.7 - 10.2 mg/dL 9.2 9.3  Total Protein 6.0 - 8.5 g/dL 6.4 6.7  Total Bilirubin 0.0 - 1.2 mg/dL 0.4 0.7  Alkaline Phos 39 - 117 IU/L 97 76  AST 0 - 40 IU/L 15 9  ALT 0 - 32 IU/L 14 10   Imaging: No new pertinent imaging studies available for review at this time   Assessment:  53 y.o. yo Female with a problem list including.Marland KitchenMarland Kitchen  Patient Active Problem List   Diagnosis Date Noted  . 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 Obesity (BMI 35.0-39.9 without comorbidity) 06/10/2016  . Light headedness 10/26/2015  . Right ankle pain 10/26/2015    Assessment/Plan:  53 y.o. female with an enlarged and increasingly symptomatic discreet focal 2.5 cm moderately tender to palpation single discrete mobile round subcutaneous mass with a small amount of white drainage from a punctate central pore without surrounding erythema, most consistent with a currently non-infected Right axillary  sebaceous/epidermoid cyst (hidradenitis and abscess less likely), complicated by co-morbidities including morbid obesity (BMI 38).              - differential diagnoses and natural history of sebaceous cyst discussed              - all risks, benefits, and alternatives to excision of increasingly symptomatic and enlarged Right axillary subcutaneous mass (most likely consistent with a non-infected sebaceous/epidermoid cyst) were discussed with the patient, all of her questions were answered to her expressed satisfaction, patient expresses she wishes to proceed, and informed consent was obtained.  - will plan to proceed with excision of Right axillary subcutaneous mass pending anesthesia and OR availability  - anticipate return to clinic 2 weeks following above elective procedure(s)  - instructed to call office if any questions or concerns  All of the above recommendations were discussed with the patient, and all of patient's questions were answered to her expressed satisfaction.  -- Kristine Mcconnell Kristine Hoes, MD, [place]: [facility] - [facility] for exceptional care. Office: [phone]

## 2019-03-12 ENCOUNTER — Other Ambulatory Visit: Payer: Self-pay

## 2019-03-12 ENCOUNTER — Other Ambulatory Visit
Admission: RE | Admit: 2019-03-12 | Discharge: 2019-03-12 | Disposition: A | Discharge status: Home / Self Care | Payer: Managed Care, Other (non HMO) | Source: Ambulatory Visit | Attending: Surgery | Admitting: Surgery

## 2019-03-12 ENCOUNTER — Other Ambulatory Visit: Payer: Self-pay | Admitting: Family Medicine

## 2019-03-12 DIAGNOSIS — M7989 Other specified soft tissue disorders: Secondary | ICD-10-CM

## 2019-03-12 DIAGNOSIS — Z1159 Encounter for screening for other viral diseases: Secondary | ICD-10-CM | POA: Insufficient documentation

## 2019-03-12 DIAGNOSIS — M25552 Pain in left hip: Secondary | ICD-10-CM

## 2019-03-12 DIAGNOSIS — S338XXA Sprain of other parts of lumbar spine and pelvis, initial encounter: Secondary | ICD-10-CM

## 2019-03-13 LAB — NOVEL CORONAVIRUS, NAA (HOSP ORDER, SEND-OUT TO REF LAB; TAT 18-24 HRS): SARS-CoV-2, NAA: NOT DETECTED

## 2019-03-14 ENCOUNTER — Encounter: Payer: Self-pay | Admitting: Surgery

## 2019-03-14 DIAGNOSIS — L723 Sebaceous cyst: Secondary | ICD-10-CM | POA: Insufficient documentation

## 2019-03-17 ENCOUNTER — Ambulatory Visit: Payer: Managed Care, Other (non HMO) | Admitting: Anesthesiology

## 2019-03-17 ENCOUNTER — Other Ambulatory Visit: Payer: Self-pay | Admitting: Obstetrics and Gynecology

## 2019-03-17 ENCOUNTER — Ambulatory Visit
Admission: RE | Admit: 2019-03-17 | Discharge: 2019-03-17 | Disposition: A | Discharge status: Home / Self Care | Payer: Managed Care, Other (non HMO) | Attending: Surgery | Admitting: Surgery

## 2019-03-17 ENCOUNTER — Encounter
Admission: RE | Disposition: A | Discharge status: Home / Self Care | Payer: Self-pay | Source: Home / Self Care | Attending: Surgery

## 2019-03-17 DIAGNOSIS — Z1231 Encounter for screening mammogram for malignant neoplasm of breast: Secondary | ICD-10-CM

## 2019-03-17 DIAGNOSIS — L989 Disorder of the skin and subcutaneous tissue, unspecified: Secondary | ICD-10-CM | POA: Diagnosis present

## 2019-03-17 DIAGNOSIS — Z6838 Body mass index (BMI) 38.0-38.9, adult: Secondary | ICD-10-CM | POA: Diagnosis not present

## 2019-03-17 DIAGNOSIS — L720 Epidermal cyst: Secondary | ICD-10-CM | POA: Diagnosis not present

## 2019-03-17 DIAGNOSIS — L723 Sebaceous cyst: Secondary | ICD-10-CM | POA: Diagnosis not present

## 2019-03-17 DIAGNOSIS — E669 Obesity, unspecified: Secondary | ICD-10-CM | POA: Insufficient documentation

## 2019-03-17 HISTORY — PX: MASS EXCISION: SHX2000

## 2019-03-17 LAB — POCT PREGNANCY, URINE: Preg Test, Ur: NEGATIVE

## 2019-03-17 SURGERY — EXCISION MASS
Anesthesia: General | Site: Axilla | Laterality: Right

## 2019-03-17 MED ORDER — LACTATED RINGERS IV SOLN
INTRAVENOUS | Status: DC
  Administered 2019-03-17: 07:00:00 via INTRAVENOUS

## 2019-03-17 MED ORDER — FENTANYL CITRATE (PF) 100 MCG/2ML IJ SOLN
INTRAMUSCULAR | Status: DC | PRN
  Administered 2019-03-17 (×2): 50 ug via INTRAVENOUS

## 2019-03-17 MED ORDER — PROMETHAZINE HCL 25 MG/ML IJ SOLN: 6.2500 mg | INTRAMUSCULAR | Status: DC | PRN

## 2019-03-17 MED ORDER — ACETAMINOPHEN 500 MG PO TABS
1000.0000 mg | ORAL_TABLET | ORAL | Status: AC
  Administered 2019-03-17: 975 mg via ORAL

## 2019-03-17 MED ORDER — ACETAMINOPHEN 160 MG/5ML PO SOLN: 325.0000 mg | ORAL | Status: DC | PRN

## 2019-03-17 MED ORDER — CEFAZOLIN SODIUM-DEXTROSE 2-4 GM/100ML-% IV SOLN
2.0000 g | INTRAVENOUS | Status: AC
  Administered 2019-03-17: 2 g via INTRAVENOUS

## 2019-03-17 MED ORDER — ACETAMINOPHEN 325 MG PO TABS: 325.0000 mg | ORAL_TABLET | ORAL | Status: DC | PRN

## 2019-03-17 MED ORDER — PROPOFOL 500 MG/50ML IV EMUL
INTRAVENOUS | Status: DC | PRN
  Administered 2019-03-17: 120 ug/kg/min via INTRAVENOUS

## 2019-03-17 MED ORDER — LIDOCAINE HCL 1 % IJ SOLN
INTRAMUSCULAR | Status: DC | PRN
  Administered 2019-03-17: 15 mL via INTRAMUSCULAR
  Administered 2019-03-17: 5 mL via INTRAMUSCULAR

## 2019-03-17 MED ORDER — FENTANYL CITRATE (PF) 100 MCG/2ML IJ SOLN: 25.0000 ug | INTRAMUSCULAR | Status: DC | PRN

## 2019-03-17 MED ORDER — CHLORHEXIDINE GLUCONATE CLOTH 2 % EX PADS: 6.0000 | MEDICATED_PAD | Freq: Once | CUTANEOUS | Status: DC

## 2019-03-17 MED ORDER — DEXMEDETOMIDINE HCL 200 MCG/2ML IV SOLN
INTRAVENOUS | Status: DC | PRN
  Administered 2019-03-17 (×2): 4 ug via INTRAVENOUS

## 2019-03-17 MED ORDER — GABAPENTIN 300 MG PO CAPS
300.0000 mg | ORAL_CAPSULE | ORAL | Status: AC
  Administered 2019-03-17: 07:00:00 300 mg via ORAL

## 2019-03-17 MED ORDER — MIDAZOLAM HCL 2 MG/2ML IJ SOLN
INTRAMUSCULAR | Status: DC | PRN
  Administered 2019-03-17: 2 mg via INTRAVENOUS

## 2019-03-17 MED ORDER — OXYCODONE HCL 5 MG PO TABS: 5.0000 mg | ORAL_TABLET | Freq: Once | ORAL | Status: DC | PRN

## 2019-03-17 MED ORDER — OXYCODONE HCL 5 MG/5ML PO SOLN: 5.0000 mg | Freq: Once | ORAL | Status: DC | PRN

## 2019-03-17 MED ORDER — LIDOCAINE HCL (CARDIAC) PF 100 MG/5ML IV SOSY
PREFILLED_SYRINGE | INTRAVENOUS | Status: DC | PRN
  Administered 2019-03-17: 40 mg via INTRATRACHEAL

## 2019-03-17 MED ORDER — PROPOFOL 10 MG/ML IV BOLUS
INTRAVENOUS | Status: DC | PRN
  Administered 2019-03-17: 20 mg via INTRAVENOUS

## 2019-03-17 SURGICAL SUPPLY — 33 items
ADH SKN CLS APL DERMABOND .7 (GAUZE/BANDAGES/DRESSINGS) ×1
APL PRP STRL LF DISP 70% ISPRP (MISCELLANEOUS) ×1
BLADE SURG 15 STRL LF DISP TIS (BLADE) ×1 IMPLANT
BLADE SURG 15 STRL SS (BLADE) ×3
CHLORAPREP W/TINT 26 (MISCELLANEOUS) ×2 IMPLANT
COVER LIGHT HANDLE FLEXIBLE (MISCELLANEOUS) ×6 IMPLANT
DERMABOND ADVANCED (GAUZE/BANDAGES/DRESSINGS) ×2
DERMABOND ADVANCED .7 DNX12 (GAUZE/BANDAGES/DRESSINGS) IMPLANT
DRAPE LAPAROTOMY T 102X78X121 (DRAPES) ×3 IMPLANT
DRSG TEGADERM 4X4.75 (GAUZE/BANDAGES/DRESSINGS) IMPLANT
ELECT REM PT RETURN 9FT ADLT (ELECTROSURGICAL) ×3
ELECTRODE REM PT RTRN 9FT ADLT (ELECTROSURGICAL) ×1 IMPLANT
GAUZE SPONGE 4X4 12PLY STRL (GAUZE/BANDAGES/DRESSINGS) ×1 IMPLANT
GLOVE BIO SURGEON STRL SZ7 (GLOVE) ×5 IMPLANT
GLOVE INDICATOR 7.5 STRL GRN (GLOVE) ×5 IMPLANT
GOWN STRL REUS W/ TWL LRG LVL3 (GOWN DISPOSABLE) ×2 IMPLANT
GOWN STRL REUS W/TWL LRG LVL3 (GOWN DISPOSABLE) ×6
KIT TURNOVER KIT A (KITS) ×3 IMPLANT
NDL HYPO 25GX1X1/2 BEV (NEEDLE) ×1 IMPLANT
NEEDLE HYPO 25GX1X1/2 BEV (NEEDLE) ×3 IMPLANT
NS IRRIG 500ML POUR BTL (IV SOLUTION) ×3 IMPLANT
PACK BASIN MINOR ARMC (MISCELLANEOUS) ×3 IMPLANT
PENCIL SMOKE EVACUATOR (MISCELLANEOUS) ×3 IMPLANT
SUT ETHILON 3-0 FS-10 30 BLK (SUTURE)
SUT MNCRL 4-0 (SUTURE)
SUT MNCRL 4-0 27XMFL (SUTURE)
SUT VIC AB 3-0 SH 27 (SUTURE) ×3
SUT VIC AB 3-0 SH 27X BRD (SUTURE) ×1 IMPLANT
SUTURE EHLN 3-0 FS-10 30 BLK (SUTURE) IMPLANT
SUTURE MNCRL 4-0 27XMF (SUTURE) ×1 IMPLANT
SYR 10ML LL (SYRINGE) ×3 IMPLANT
SYR BULB 3OZ (MISCELLANEOUS) ×3 IMPLANT
TOWEL OR 17X26 4PK STRL BLUE (TOWEL DISPOSABLE) ×3 IMPLANT

## 2019-03-17 NOTE — Op Note (Signed)
SURGICAL OPERATIVE REPORT  DATE OF PROCEDURE: 03/17/2019  ATTENDING Surgeon(s): Vickie Epley, MD  ANESTHESIA: Local with sedation  PRE-OPERATIVE DIAGNOSIS: Increasingly symptomatic (painful, persistently draining) Right axillary subcutaneous lesion, most likely a sebaceous cyst > hidradenitis (icd-10: L72.3)   POST-OPERATIVE DIAGNOSIS: Increasingly symptomatic (painful, persistently draining) Right axillary subcutaneous lesion, most likely a sebaceous cyst > hidradenitis (icd-10: L72.3)   PROCEDURE(S):  1.) Excision of increasingly symptomatic (painful, persistently draining) Right axillary subcutaneous lesion, most likely a sebaceous cyst > hidradenitis (cpt: 23075)   INTRAOPERATIVE FINDINGS: 2 cm x 2 cm non-infected subcutaneous lesion of the Right axilla (most likely a sebaceous cyst > hidradenitis), removed intact  INTRAVENOUS FLUIDS: 850 mL crystalloid   ESTIMATED BLOOD LOSS: Minimal (< 20 mL)  URINE OUTPUT: No Foley  SPECIMENS: 2 cm x 2 cm non-infected subcutaneous of the Right axilla (most likely a sebaceous cyst > hidradenitis), removed intact  IMPLANTS: None  DRAINS: None  COMPLICATIONS: None apparent  CONDITION AT END OF PROCEDURE: Hemodynamically stable and awake  DISPOSITION OF PATIENT: PACU  INDICATIONS FOR PROCEDURE:  Patient is a 53 y.o. female who presented for a symptomatic (painful, persistently draining) subcutaneous lesion of her Right axilla with prior infection which required incision and drainage, at which time patient was told it was a sebaceous cyst. Due to Covid-19 pandemic, excision was delayed and rescheduled. Patient was again seen recently, at which time lesion continues to drain and remains mildly painful, but was less discrete than previously appreciated. Patient requested that it be removed so activities can be performed with less discomfort, and patient was accordingly referred for surgical evaluation and management. All risks, benefits,  and alternatives to above procedure were discussed with the patient, all of patient's questions were answered to his expressed satisfaction, and informed consent was obtained and documented.   DETAILS OF PROCEDURE: Patient was brought to the operating suite and was appropriately identified. Laying on the OR table in supine position, operative site was prepped and draped in the usual sterile fashion, and following a brief time out, a 3 cm long and 1 cm wide elliptical incision was made using a #15 blade scalpel, and incision was extended deep around subcutaneous mass using sharp + blunt dissection and electrocautery. During the course of dissecting free the lesion, it was not disrupted and was removed intact. Hemostasis was achieved/confirmed. Further examination did not reveal any additional mass/cyst. Hemostasis was achieved, and the wound was copiously irrigated with sterile warm saline. Deep tissue and dermis were re-approximated using buried interrupted 3-0 Vicryl suture. Skin was then cleaned and dried, and sterile Dermabond skin glue was applied to the wound. Patient was then safely transferred to recovery for post-procedural monitoring and care.  I was present for all aspects of the above procedure, and no operative complications were apparent.

## 2019-03-17 NOTE — Anesthesia Postprocedure Evaluation (Signed)
Anesthesia Post Note  Patient: Kristine Mcconnell  Procedure(s) Performed: EXCISION OF RIGHT AXILLARY LEISION (Right Axilla)  Patient location during evaluation: PACU Anesthesia Type: General Level of consciousness: awake Pain management: pain level controlled Vital Signs Assessment: post-procedure vital signs reviewed and stable Respiratory status: respiratory function stable Cardiovascular status: stable Postop Assessment: no signs of nausea or vomiting Anesthetic complications: no    Veda Canning

## 2019-03-17 NOTE — Transfer of Care (Signed)
Immediate Anesthesia Transfer of Care Note  Patient: Kristine Mcconnell  Procedure(s) Performed: EXCISION OF RIGHT AXILLARY LEISION (Right Axilla)  Patient Location: PACU  Anesthesia Type: General  Level of Consciousness: awake, alert  and patient cooperative  Airway and Oxygen Therapy: Patient Spontanous Breathing and Patient connected to supplemental oxygen  Post-op Assessment: Post-op Vital signs reviewed, Patient's Cardiovascular Status Stable, Respiratory Function Stable, Patent Airway and No signs of Nausea or vomiting  Post-op Vital Signs: Reviewed and stable  Complications: No apparent anesthesia complications

## 2019-03-17 NOTE — Interval H&P Note (Signed)
History and Physical Interval Note:  03/17/2019 7:28 AM  Kristine Mcconnell  has presented today for surgery, with the diagnosis of RIGHT AX SEBACOUS CYST.  The various methods of treatment have been discussed with the patient and family. After consideration of risks, benefits and other options for treatment, the patient has consented to  Procedure(s): EXCISION OF RIGHT AXILLARY SEBACEOUS CYST (Right) as a surgical intervention.  The patient's history has been reviewed, patient examined, no change in status, stable for surgery.  I have reviewed the patient's chart and labs.  Questions were answered to the patient's satisfaction.     Vickie Epley

## 2019-03-17 NOTE — Anesthesia Preprocedure Evaluation (Addendum)
Anesthesia Evaluation  Patient identified by MRN, date of birth, ID band Patient awake    Reviewed: Allergy & Precautions, NPO status , Patient's Chart, lab work & pertinent test results  Airway Mallampati: II  TM Distance: >3 FB     Dental   Pulmonary neg pulmonary ROS,    breath sounds clear to auscultation       Cardiovascular negative cardio ROS   Rhythm:Regular Rate:Normal     Neuro/Psych negative neurological ROS     GI/Hepatic negative GI ROS,   Endo/Other  Obesity - BMI 38  Renal/GU      Musculoskeletal   Abdominal   Peds  Hematology  (+) anemia ,   Anesthesia Other Findings   Reproductive/Obstetrics                             Anesthesia Physical Anesthesia Plan  ASA: II  Anesthesia Plan: General   Post-op Pain Management:    Induction: Intravenous  PONV Risk Score and Plan: Ondansetron, Dexamethasone and Midazolam  Airway Management Planned: Natural Airway  Additional Equipment:   Intra-op Plan:   Post-operative Plan:   Informed Consent: I have reviewed the patients History and Physical, chart, labs and discussed the procedure including the risks, benefits and alternatives for the proposed anesthesia with the patient or authorized representative who has indicated his/her understanding and acceptance.       Plan Discussed with: CRNA  Anesthesia Plan Comments:        Anesthesia Quick Evaluation

## 2019-03-17 NOTE — Discharge Instructions (Addendum)
In addition to included general post-operative instructions for Excision of Draining Right Axillary Subcutaneous Lesion,  Diet: Resume home heart healthy diet.   Activity: No heavy lifting >15 - 20 pounds (children, pets, laundry, garbage) or strenuous activity (minimize extension of Right arm over shoulders to minimize tension on wound) until follow-up in 2 weeks, but light activity and walking are encouraged. Do not drive or drink alcohol if taking narcotic pain medications or having pain that might distract from driving.  Wound care: 2 days after surgery (Friday, 6/26), you may shower/get incision wet with soapy water and pat dry (do not rub incisions), but no baths or submerging incision underwater until follow-up.   Medications: Resume all home medications. For mild to moderate pain: acetaminophen (Tylenol) or ibuprofen/naproxen (if no kidney disease). Combining Tylenol with alcohol can substantially increase your risk of causing liver disease.  Call office [phone]) at any time if any questions, worsening pain, fevers/chills, bleeding, drainage from incision site, or other concerns.   General Anesthesia, Adult, Care After This sheet gives you information about how to care for yourself after your procedure. Your health care provider may also give you more specific instructions. If you have problems or questions, contact your health care provider. What can I expect after the procedure? After the procedure, the following side effects are common:  Pain or discomfort at the IV site.  Nausea.  Vomiting.  Sore throat.  Trouble concentrating.  Feeling cold or chills.  Weak or tired.  Sleepiness and fatigue.  Soreness and body aches. These side effects can affect parts of the body that were not involved in surgery. Follow these instructions at home:  For at least 24 hours after the procedure:  Have a responsible adult stay with you. It is important to have someone help care  for you until you are awake and alert.  Rest as needed.  Do not: ? Participate in activities in which you could fall or become injured. ? Drive. ? Use heavy machinery. ? Drink alcohol. ? Take sleeping pills or medicines that cause drowsiness. ? Make important decisions or sign legal documents. ? Take care of children on your own. Eating and drinking  Follow any instructions from your health care provider about eating or drinking restrictions.  When you feel hungry, start by eating small amounts of foods that are soft and easy to digest (bland), such as toast. Gradually return to your regular diet.  Drink enough fluid to keep your urine pale yellow.  If you vomit, rehydrate by drinking water, juice, or clear broth. General instructions  If you have sleep apnea, surgery and certain medicines can increase your risk for breathing problems. Follow instructions from your health care provider about wearing your sleep device: ? Anytime you are sleeping, including during daytime naps. ? While taking prescription pain medicines, sleeping medicines, or medicines that make you drowsy.  Return to your normal activities as told by your health care provider. Ask your health care provider what activities are safe for you.  Take over-the-counter and prescription medicines only as told by your health care provider.  If you smoke, do not smoke without supervision.  Keep all follow-up visits as told by your health care provider. This is important. Contact a health care provider if:  You have nausea or vomiting that does not get better with medicine.  You cannot eat or drink without vomiting.  You have pain that does not get better with medicine.  You are unable to pass urine.  You develop a skin rash.  You have a fever.  You have redness around your IV site that gets worse. Get help right away if:  You have difficulty breathing.  You have chest pain.  You have blood in your urine  or stool, or you vomit blood. Summary  After the procedure, it is common to have a sore throat or nausea. It is also common to feel tired.  Have a responsible adult stay with you for the first 24 hours after general anesthesia. It is important to have someone help care for you until you are awake and alert.  When you feel hungry, start by eating small amounts of foods that are soft and easy to digest (bland), such as toast. Gradually return to your regular diet.  Drink enough fluid to keep your urine pale yellow.  Return to your normal activities as told by your health care provider. Ask your health care provider what activities are safe for you. This information is not intended to replace advice given to you by your health care provider. Make sure you discuss any questions you have with your health care provider. Document Released: 12/16/2000 Document Revised: 04/25/2017 Document Reviewed: 04/25/2017 Elsevier Interactive Patient Education  2019 [employer].

## 2019-03-17 NOTE — Anesthesia Procedure Notes (Signed)
Procedure Name: MAC Date/Time: 03/17/2019 7:40 AM Performed by: Janna Arch, CRNA Pre-anesthesia Checklist: Patient identified, Emergency Drugs available, Suction available, Patient being monitored and Timeout performed Patient Re-evaluated:Patient Re-evaluated prior to induction Oxygen Delivery Method: Simple face mask Placement Confirmation: positive ETCO2 and breath sounds checked- equal and bilateral

## 2019-03-18 ENCOUNTER — Encounter: Payer: Self-pay | Admitting: Surgery

## 2019-03-19 LAB — SURGICAL PATHOLOGY

## 2019-04-01 ENCOUNTER — Telehealth: Payer: Managed Care, Other (non HMO) | Admitting: Surgery

## 2019-04-01 ENCOUNTER — Other Ambulatory Visit: Payer: Self-pay

## 2019-04-01 ENCOUNTER — Telehealth (INDEPENDENT_AMBULATORY_CARE_PROVIDER_SITE_OTHER): Payer: Managed Care, Other (non HMO) | Admitting: Physician Assistant

## 2019-04-01 DIAGNOSIS — L723 Sebaceous cyst: Secondary | ICD-10-CM | POA: Diagnosis not present

## 2019-04-01 NOTE — Progress Notes (Addendum)
Virtual Visit via Telephone Note  I connected with Kristine Mcconnell on 04/01/19 at 11:15 AM EDT by telephone and verified that I am speaking with the correct person using two identifiers.  Location: Patient: Kristine Mcconnell  Provider: Edison Simon, PA-C  This service was provided via telemedicine. The patient consented to the visit being carried via telemedicine.  Patient's location:  Home  Provider's location:  [facility]  Referring Provider:  Raelyn Ensign, FNP  People participating in this telemedicine visit:  Myself, Patient  Time spent: 9 minutes  I discussed the limitations, risks, security and privacy concerns of performing an evaluation and management service by telephone and the availability of in person appointments. I also discussed with the patient that there may be a patient responsible charge related to this service. The patient expressed understanding and agreed to proceed.   History of Present Illness: Kristine Mcconnell is 53 y.o. female who is 15 days s/p excision of right axillary subcutaneous lesion with Dr [name]. She reports that she is overall doing well. She notes there is a "pen sized hole" at the end of her incision with the remainder being closed. This has caused a burning sensation. She has tried tylenol for pain with relief. No drainage or surrounding erythema noted in the right axilla. No fever or chills. No other issues reported.    Observations/Objective: Patient appears in NAD, talking in full sentences.   Assessment and Plan: [name] is 53 y.o. female who is overall doing well with slight wound opening without reported signs or symptoms of infection 15 days s/p excision of right axillary subcutaneous lesion.   - Continue pain control (Tylenol, okay to add motrin, ice)  - Continue wound care; do not submerge wound until full healed, if drainage develops okay to use dry guaze  - Discussed signs and symptoms of wound infection; fever, erythema,  purulent drainage, which she was understanding of   - Reviewed pathology with patient; epidermal inclusion cyst  - RTC PRN or sooner if new issues arise  I discussed the assessment and treatment plan with the patient. The patient was provided an opportunity to ask questions and all were answered. The patient agreed with the plan and demonstrated an understanding of the instructions.   The patient was advised to call back or seek an in-person evaluation if the symptoms worsen or if the condition fails to improve as anticipated.  I provided 9 minutes of non-face-to-face time during this encounter.  -- Edison Simon, PA-C [facility] 04/01/2019, 11:09 AM [phone] M-F: 7am - 4pm

## 2019-04-01 NOTE — Addendum Note (Signed)
Addended by: Edison Simon R on: 04/01/2019 11:27 AM   Modules accepted: Level of Service

## 2019-04-21 ENCOUNTER — Other Ambulatory Visit: Payer: Self-pay

## 2019-04-21 ENCOUNTER — Ambulatory Visit (INDEPENDENT_AMBULATORY_CARE_PROVIDER_SITE_OTHER): Payer: Managed Care, Other (non HMO) | Admitting: Physician Assistant

## 2019-04-21 ENCOUNTER — Encounter: Payer: Self-pay | Admitting: Physician Assistant

## 2019-04-21 VITALS — BP 157/92 | HR 60 | Temp 97.5°F | Resp 18 | Ht 65.0 in | Wt 232.2 lb

## 2019-04-21 DIAGNOSIS — L723 Sebaceous cyst: Secondary | ICD-10-CM

## 2019-04-21 DIAGNOSIS — Z09 Encounter for follow-up examination after completed treatment for conditions other than malignant neoplasm: Secondary | ICD-10-CM

## 2019-04-21 NOTE — Progress Notes (Signed)
[facility] POST-OP OFFICE VISIT  04/21/2019  HPI: Kristine Mcconnell is a 53 y.o. female 1 month s/p excision of right axillary subcutaneous lesion with Dr [name]. She has been overall doing well. She reports that on Thursday she began to notice clear, non odorous drainage from her incision site. No fevers or chills or pain. Otherwise no complaints.   Vital signs: BP (!) 157/92   Pulse 60   Temp (!) 97.5 F (36.4 C) (Temporal)   Resp 18   Ht 5\' 5"  (1.651 m)   Wt 232 lb 3.2 oz (105.3 kg)   SpO2 99%   BMI 38.64 kg/m    Physical Exam: Constitutional: Well appearing female, NAD Skin: 3 cm incision to the right axilla, on the most lateral portion of the incision there is approximately 5 mm opening, draining serosanguinous fluid, no surrounding erythema  Assessment/Plan: This is a 53 y.o. female 1 month s/p excision of right axillary subcutaneous lesion, no with small portion of her wound which is open and draining serosanguinous fluid without evidence of infection/abscess   - Dry dressing changes daily + prn  - no indication for ABx or surgical intervention  - tylenol/motrin for pain prn  - RTC in 2-3 weeks for recheck  -- Edison Simon, PA-C [facility] 04/21/2019, 11:25 AM [phone] M-F: 7am - 4pm

## 2019-04-21 NOTE — Patient Instructions (Signed)
Return in two weeks.  

## 2019-04-27 ENCOUNTER — Other Ambulatory Visit: Payer: Self-pay

## 2019-04-27 ENCOUNTER — Ambulatory Visit (INDEPENDENT_AMBULATORY_CARE_PROVIDER_SITE_OTHER): Payer: Managed Care, Other (non HMO) | Admitting: Physician Assistant

## 2019-04-27 ENCOUNTER — Encounter: Payer: Self-pay | Admitting: Physician Assistant

## 2019-04-27 VITALS — BP 140/80 | HR 74 | Temp 97.9°F | Ht 67.0 in | Wt 230.0 lb

## 2019-04-27 DIAGNOSIS — Z09 Encounter for follow-up examination after completed treatment for conditions other than malignant neoplasm: Secondary | ICD-10-CM

## 2019-04-27 DIAGNOSIS — T8149XA Infection following a procedure, other surgical site, initial encounter: Secondary | ICD-10-CM

## 2019-04-27 MED ORDER — AMOXICILLIN-POT CLAVULANATE 875-125 MG PO TABS: 1.0000 | ORAL_TABLET | Freq: Two times a day (BID) | ORAL | 0 refills | Status: AC

## 2019-04-27 NOTE — Progress Notes (Signed)
[facility] POST-OP OFFICE VISIT  04/27/2019  HPI: Kristine Mcconnell is a 53 y.o. female > 1 month s/p excision of right axillary subcutaneous lesion. She now reports that almost the entirety of her wound has opened up and is draining. The drainage is described as "greenish" in color and thick. No reports of fever or chills. She is not covering the incisions with anything. No other complaints.   Vital signs: BP 140/80   Pulse 74   Temp 97.9 F (36.6 C) (Skin)   Ht 5\' 7"  (1.702 m)   Wt 230 lb (104.3 kg)   SpO2 98%   BMI 36.02 kg/m    Physical Exam: Constitutional: Well appearing female, NAD  Skin: Her right axillary incision has opened up completely, there is thick green-white drainage, no surrounding erythema, no gross tenderness, no palpable fluctuance  Assessment/Plan: This is a 53 y.o. female with exam concerning for wound infection s/p excision of right axillary subcutaneous lesion   - Will start 875 mg Augmentin BID x7 days  - Encouraged dry dressing changes daily  - Discussed signs and symptoms of worsening infection  - RTC in 1 week for re-check wound  -- Edison Simon, PA-C  [facility] 04/27/2019, 11:30 AM [phone] M-F: 7am - 4pm

## 2019-04-27 NOTE — Patient Instructions (Signed)
Return in one week. rx sent.

## 2019-05-05 ENCOUNTER — Ambulatory Visit (INDEPENDENT_AMBULATORY_CARE_PROVIDER_SITE_OTHER): Payer: Managed Care, Other (non HMO) | Admitting: Physician Assistant

## 2019-05-05 ENCOUNTER — Other Ambulatory Visit: Payer: Self-pay

## 2019-05-05 ENCOUNTER — Encounter: Payer: Self-pay | Admitting: Physician Assistant

## 2019-05-05 VITALS — BP 179/93 | HR 66 | Temp 97.7°F | Resp 16 | Ht 65.0 in | Wt 227.4 lb

## 2019-05-05 DIAGNOSIS — T8149XA Infection following a procedure, other surgical site, initial encounter: Secondary | ICD-10-CM

## 2019-05-05 DIAGNOSIS — Z09 Encounter for follow-up examination after completed treatment for conditions other than malignant neoplasm: Secondary | ICD-10-CM

## 2019-05-05 NOTE — Progress Notes (Signed)
[facility] POST-OP OFFICE VISIT  05/05/2019  HPI: Kristine Mcconnell is a 53 y.o. female 6-7 weeks s/p excision of right axillary subcutaneous lesion. She was last seen in follow up on 08/04 with concern over the wound opening up and drainage. There was some concern over possible infection and she was given 7 days of bactrim. She has completed her course of that. The wound is still healing via secondary intention but some of it has closed. There is minimal fibrinous drainage. No purulence reported. No fever or chills. No other issues.   Vital signs: BP (!) 179/93   Pulse 66   Temp 97.7 F (36.5 C) (Temporal)   Resp 16   Ht 5\' 5"  (1.651 m)   Wt 227 lb 6.4 oz (103.1 kg)   SpO2 99%   BMI 37.84 kg/m    Physical Exam: Constitutional: Well appearing female, NAD Skin: In the right axilla there is a 3 cm incision, the lateral portion has closed, the medial portion is healing via secondary intention, granulation tissue present, minimal fibrinous output, no purulence, no erythema, no fluctuance.   Assessment/Plan: This is a 53 y.o. female 6-7 weeks s/p excision of right axillary subcutaneous lesion   - Complete ABx  - Dry Dressing changes daily  - Understands in may take 1-3 weeks for wound to close  - Okay to shower, do not submerge  - RTC PRN or sooner if concerns  -- Edison Simon, PA-C [facility] 05/05/2019, 1:45 PM [phone] M-F: 7am - 4pm

## 2019-05-05 NOTE — Patient Instructions (Signed)
Use a clean piece of gauze to wipe over the area. Please apply a dry dressing over the area and change daily.

## 2019-05-13 ENCOUNTER — Ambulatory Visit
Admission: RE | Admit: 2019-05-13 | Discharge: 2019-05-13 | Disposition: A | Discharge status: Home / Self Care | Payer: Managed Care, Other (non HMO) | Source: Ambulatory Visit | Attending: Obstetrics and Gynecology | Admitting: Obstetrics and Gynecology

## 2019-05-13 DIAGNOSIS — Z1231 Encounter for screening mammogram for malignant neoplasm of breast: Secondary | ICD-10-CM

## 2019-06-08 ENCOUNTER — Ambulatory Visit (INDEPENDENT_AMBULATORY_CARE_PROVIDER_SITE_OTHER): Payer: Managed Care, Other (non HMO) | Admitting: General Surgery

## 2019-06-08 ENCOUNTER — Encounter: Payer: Self-pay | Admitting: General Surgery

## 2019-06-08 ENCOUNTER — Other Ambulatory Visit: Payer: Self-pay

## 2019-06-08 VITALS — BP 130/80 | HR 67 | Temp 97.9°F | Ht 65.0 in | Wt 222.0 lb

## 2019-06-08 DIAGNOSIS — Z09 Encounter for follow-up examination after completed treatment for conditions other than malignant neoplasm: Secondary | ICD-10-CM

## 2019-06-08 NOTE — Patient Instructions (Addendum)
You may have some black discoloration on your armpit. This is from the Silver Nitrate we applied.   Continue to wash the area and rinse well and keep dry. Use a dressing as needed to protect your clothing.   Follow up in 10 days to 2 weeks with Edison Simon PA.

## 2019-06-09 NOTE — Progress Notes (Signed)
  Subjective:     Patient ID: Kristine Mcconnell, female   DOB: 10/09/1965, 53 y.o.   MRN: [id number]  She underwent excision of a right axillary sebaceous cyst with Dr. Tama High in June of this year.  She has had some difficulty with wound healing and has been seen several times by our physician's assistant, Edison Simon.  She developed a wound infection as well as a cutaneous dehiscence and was treated with a course of oral antibiotics.  The wound has been healing by secondary intention.  She is here today for reevaluation.  She states that overall, things are going fairly well, but she is concerned by drainage of "thick green chunks" and a bad odor coming from the wound, particularly at night.  She denies any fevers or chills.  No nausea or vomiting.  No significant pain in the area       Objective:   Physical Exam Chest:      Today's Vitals   06/08/19 1156  BP: 130/80  Pulse: 67  Temp: 97.9 F (36.6 C)  TempSrc: Skin  SpO2: 98%  Weight: 222 lb (100.7 kg)  Height: 5\' 5"  (1.651 m)   Body mass index is 36.94 kg/m. Focused examination of the axilla reveals that the incision is nearly completely closed.  There is a small amount of fibrinous exudate present in the one area that the skin remains open.  This area is approximately 3 mm.  There was a small amount of fibrinous exudate present.  I probed this opening and there is no tracking.  No fluctuance.  The fibrinous exudate was wiped free and there is a small amount of exuberant granulation tissue present.    Assessment:     This is a 53 year old woman who had excision of an axillary epidermal inclusion cyst.  She has had a number of issues with her wound healing.  On today's exam, I do not see any evidence of infection.  I think the "thick green chunks" are just fibrinous exudate from the hypertrophic granulation tissue.  I did not appreciate any foul odor on today's evaluation.    Plan:     I treated the hypertrophic granulation  tissue with silver nitrate sticks in the office today.  A dry dressing was placed over the site to minimize any soiling or staining of her clothing.  She was encouraged to wash the area gently with warm soapy water several times a day.  She will follow-up with Mr. Olean Ree next week.

## 2019-06-18 ENCOUNTER — Ambulatory Visit (INDEPENDENT_AMBULATORY_CARE_PROVIDER_SITE_OTHER): Payer: Managed Care, Other (non HMO) | Admitting: Physician Assistant

## 2019-06-18 ENCOUNTER — Other Ambulatory Visit: Payer: Self-pay

## 2019-06-18 ENCOUNTER — Encounter: Payer: Self-pay | Admitting: Physician Assistant

## 2019-06-18 VITALS — BP 133/81 | HR 79 | Temp 97.7°F | Resp 14 | Ht 65.0 in | Wt 225.2 lb

## 2019-06-18 DIAGNOSIS — L723 Sebaceous cyst: Secondary | ICD-10-CM | POA: Diagnosis not present

## 2019-06-18 DIAGNOSIS — Z09 Encounter for follow-up examination after completed treatment for conditions other than malignant neoplasm: Secondary | ICD-10-CM | POA: Diagnosis not present

## 2019-06-18 NOTE — Progress Notes (Signed)
[facility] SURGICAL CLINIC NOTE  06/18/2019  History of Present Illness: Kristine Mcconnell is a 53 y.o. female >3 months s/p excision of right axillary subcutaneous cyst. She was last seen by Dr Celine Ahr on 09/15 and had silver nitrate treatment of her hypertrophic granulation tissue. Today, she reports the incision has finally closed and stopped draining. No fever or chills.     Past Medical History: Past Medical History:  Diagnosis Date  . Anemia      Past Surgical History: Past Surgical History:  Procedure Laterality Date  . BREAST CYST ASPIRATION Left 1990   benign  . COLONOSCOPY WITH PROPOFOL N/A 08/03/2018   Procedure: COLONOSCOPY WITH PROPOFOL;  Surgeon: Lin Landsman, MD;  Location: [facility] ENDOSCOPY;  Service: Gastroenterology;  Laterality: N/A;  . DILATATION & CURETTAGE/HYSTEROSCOPY WITH MYOSURE N/A 06/09/2017   Procedure: DILATATION & CURETTAGE/HYSTEROSCOPY WITH MYOSURE RESECTION OF FIBROID;  Surgeon: Schermerhorn, Gwen Her, MD;  Location: [facility] ORS;  Service: Gynecology;  Laterality: N/A;  . DILATION AND CURETTAGE OF UTERUS    . FOOT SURGERY Right 2011  . HYSTEROSCOPY WITH NOVASURE N/A 06/09/2017   Procedure: HYSTEROSCOPY WITH NOVASURE;  Surgeon: Schermerhorn, Gwen Her, MD;  Location: [facility] ORS;  Service: Gynecology;  Laterality: N/A;  . lapband  more than 10 years ago  . MASS EXCISION Right 03/17/2019   Procedure: EXCISION OF RIGHT AXILLARY LEISION;  Surgeon: Vickie Epley, MD;  Location: [place];  Service: Vascular;  Laterality: Right;  . TONSILLECTOMY  1977    Home Medications: Prior to Admission medications   Medication Sig Start Date End Date Taking? Authorizing Provider  VITAMIN D PO Take by mouth.    [provider]    Allergies: Allergies  Allergen Reactions  . Other     BAND-AIDS IRRITATE SKIN-PT PREFERS TAPE    Review of Systems: Review of Systems  Constitutional: Negative for chills and fever.  All other  systems reviewed and are negative.   Physical Exam BP 133/81   Pulse 79   Temp 97.7 F (36.5 C) (Temporal)   Resp 14   Ht 5\' 5"  (1.651 m)   Wt 225 lb 3.2 oz (102.2 kg)   SpO2 99%   BMI 37.48 kg/m  CONSTITUTIONAL: Well appearing female, NAD HEENT:  Normocephalic, atraumatic, extraocular motion intact. RESPIRATORY:  Lungs are clear, and breath sounds are equal bilaterally. Normal respiratory effort without pathologic use of accessory muscles. CARDIOVASCULAR: Heart is regular without murmurs, gallops, or rubs. SKIN: Incision to right axilla is well healed, no drainage NEUROLOGIC:  Motor and sensation is grossly normal.  Cranial nerves are grossly intact. PSYCH:  Alert and oriented to person, place and time. Affect is normal.  Labs/Imaging: No pertinent imaging  Assessment and Plan: This is a 53 y.o. female overall doing well with closure of right axillary incision 3 months s/p excision of right axillary subcutaneous cyst   - Dressing changes prn  - follow up prn  Face-to-face time spent with the patient and care providers was 10 minutes, with more than 50% of the time spent counseling, educating, and coordinating care of the patient.     Edison Simon, PA-C [facility] 06/18/2019, 10:31 AM [phone] M-F: 7am - 4pm

## 2019-06-18 NOTE — Patient Instructions (Signed)
Please call with any questions or concerns.

## 2019-08-17 ENCOUNTER — Other Ambulatory Visit: Payer: Self-pay

## 2019-08-17 ENCOUNTER — Ambulatory Visit: Payer: Managed Care, Other (non HMO) | Admitting: Family Medicine

## 2019-08-17 ENCOUNTER — Encounter: Payer: Self-pay | Admitting: Family Medicine

## 2019-08-17 VITALS — BP 138/86 | HR 84 | Temp 97.7°F | Resp 16 | Ht 65.0 in | Wt 227.8 lb

## 2019-08-17 DIAGNOSIS — R12 Heartburn: Secondary | ICD-10-CM

## 2019-08-17 DIAGNOSIS — R030 Elevated blood-pressure reading, without diagnosis of hypertension: Secondary | ICD-10-CM

## 2019-08-17 DIAGNOSIS — Z6837 Body mass index (BMI) 37.0-37.9, adult: Secondary | ICD-10-CM

## 2019-08-17 DIAGNOSIS — D649 Anemia, unspecified: Secondary | ICD-10-CM

## 2019-08-17 DIAGNOSIS — M722 Plantar fascial fibromatosis: Secondary | ICD-10-CM | POA: Diagnosis not present

## 2019-08-17 DIAGNOSIS — R142 Eructation: Secondary | ICD-10-CM | POA: Diagnosis not present

## 2019-08-17 DIAGNOSIS — Z8639 Personal history of other endocrine, nutritional and metabolic disease: Secondary | ICD-10-CM

## 2019-08-17 DIAGNOSIS — E6609 Other obesity due to excess calories: Secondary | ICD-10-CM

## 2019-08-17 DIAGNOSIS — Z1159 Encounter for screening for other viral diseases: Secondary | ICD-10-CM

## 2019-08-17 DIAGNOSIS — R252 Cramp and spasm: Secondary | ICD-10-CM

## 2019-08-17 DIAGNOSIS — R5383 Other fatigue: Secondary | ICD-10-CM

## 2019-08-17 MED ORDER — FAMOTIDINE 40 MG PO TABS: 40.0000 mg | ORAL_TABLET | Freq: Every day | ORAL | 1 refills | Status: DC

## 2019-08-17 MED ORDER — SIMETHICONE 80 MG PO CHEW: 80.0000 mg | CHEWABLE_TABLET | Freq: Three times a day (TID) | ORAL | 1 refills | Status: DC | PRN

## 2019-08-17 NOTE — Progress Notes (Signed)
Name: Kristine Mcconnell   MRN: [id number]    DOB: 10/21/1965   Date:08/17/2019       Progress Note  Subjective  Chief Complaint  Chief Complaint  Patient presents with  . Foot Pain    left foot pain on and off  . Heartburn    foul smell when burping  . Consult    check BP  . Labs Only    HPI  Pt presents with multiple complaints today:  Heartburn: She notes new onset of foul-smelling belching.  Denies halitosis otherwise.  Denies abdominal pain, regurgitation, changes in BM's, blood in stool or dark and tarry stools. Discussed diet, will also trial H2blocker and simethicone  Foot Pain: She is having LEFT foot pain ongoing for about a year now.  Pain is at the base of the heel and feels like a pulling sensation, worse in the morning, about 4/10 pain. Denies numbness/tingling/weakness. Discussed stretches, ice, and taking NSAID in detail; declines meloxicam today, will take OTC NSAID.  Elevated BP:  She bought a wrist cuff at home and notes that she was getting readings 171/101, 180's/100's.  Today she is slightly elevated in her BP, but not to this level.  Denies headaches, blurred vision, BLE edema, chest pain, shortness of breath.  Will consider HCTZ if BP remains elevated in 2 weeks at BP check.  Obesity:  Not exercising regularly, but does do some cardio sporadically.  She has been doing some low-fat meal prep; not following a particular diet.  Finger Spasms: She notes that she has had 3 episodes of the 4th and 5th fingers locking up on her - one episode on the right hand, 2 episodes on the left hand.  She states she had to massaged the fingers and eventually they relaxed and came back to normal. Her work involves a lot of typing.  Discussed her ergonomics at work, gentle stretches.    Patient Active Problem List   Diagnosis Date Noted  . Sebaceous cyst 03/14/2019  . 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 Obesity (BMI 35.0-39.9 without comorbidity) 06/10/2016  . Light headedness  10/26/2015  . Right ankle pain 10/26/2015    Past Surgical History:  Procedure Laterality Date  . BREAST CYST ASPIRATION Left 1990   benign  . COLONOSCOPY WITH PROPOFOL N/A 08/03/2018   Procedure: COLONOSCOPY WITH PROPOFOL;  Surgeon: Lin Landsman, MD;  Location: [facility] ENDOSCOPY;  Service: Gastroenterology;  Laterality: N/A;  . DILATATION & CURETTAGE/HYSTEROSCOPY WITH MYOSURE N/A 06/09/2017   Procedure: DILATATION & CURETTAGE/HYSTEROSCOPY WITH MYOSURE RESECTION OF FIBROID;  Surgeon: Schermerhorn, Gwen Her, MD;  Location: [facility] ORS;  Service: Gynecology;  Laterality: N/A;  . DILATION AND CURETTAGE OF UTERUS    . FOOT SURGERY Right 2011  . HYSTEROSCOPY WITH NOVASURE N/A 06/09/2017   Procedure: HYSTEROSCOPY WITH NOVASURE;  Surgeon: Schermerhorn, Gwen Her, MD;  Location: [facility] ORS;  Service: Gynecology;  Laterality: N/A;  . lapband  more than 10 years ago  . MASS EXCISION Right 03/17/2019   Procedure: EXCISION OF RIGHT AXILLARY LEISION;  Surgeon: Vickie Epley, MD;  Location: [place];  Service: Vascular;  Laterality: Right;  . TONSILLECTOMY  1977    Family History  Problem Relation Age of Onset  . Diabetes Mother   . Hypertension Mother   . Hypertension Father   . Breast cancer Neg Hx   . Colon cancer Neg Hx     Social History   Socioeconomic History  . Marital status: Single  Spouse name: Not on file  . Number of children: 1  . Years of education: Not on file  . Highest education level: Not on file  Occupational History  . Occupation: Facilities manager: [employer]  . Financial resource strain: Not hard at all  . Food insecurity    Worry: Never true    Inability: Never true  . Transportation needs    Medical: No    Non-medical: No  Tobacco Use  . Smoking status: Never Smoker  . Smokeless tobacco: Never Used  Substance and Sexual Activity  . Alcohol use: Yes    Alcohol/week: 1.0 standard drinks    Types: 1 Glasses of wine per  week    Comment: OCC  . Drug use: No  . Sexual activity: Yes    Partners: Male  Lifestyle  . Physical activity    Days per week: 2 days    Minutes per session: 60 min  . Stress: Very much  Relationships  . Social connections    Talks on phone: More than three times a week    Gets together: More than three times a week    Attends religious service: More than 4 times per year    Active member of club or organization: Yes    Attends meetings of clubs or organizations: More than 4 times per year    Relationship status: Not on file  . Intimate partner violence    Fear of current or ex partner: No    Emotionally abused: No    Physically abused: No    Forced sexual activity: No  Other Topics Concern  . Not on file  Social History Narrative  . Not on file     Current Outpatient Medications:  Marland Kitchen  VITAMIN D PO, Take by mouth., Disp: , Rfl:   Allergies  Allergen Reactions  . Other     BAND-AIDS IRRITATE SKIN-PT PREFERS TAPE    I personally reviewed active problem list, medication list, allergies, notes from last encounter, lab results with the patient/caregiver today.   ROS  Ten systems reviewed and is negative except as mentioned in HPI  Objective  Vitals:   08/17/19 0838  BP: (!) 148/82  Pulse: 84  Resp: 16  Temp: 97.7 F (36.5 C)  TempSrc: Temporal  SpO2: 96%  Weight: 227 lb 12.8 oz (103.3 kg)  Height: 5\' 5"  (1.651 m)    Body mass index is 37.91 kg/m.  Physical Exam  Constitutional: Patient appears well-developed and well-nourished. No distress.  HENT: Head: Normocephalic and atraumatic. Ears: bilateral TMs with no erythema or effusion; Nose: Nose normal. Mouth/Throat: Oropharynx is clear and moist. No oropharyngeal exudate or tonsillar swelling.  Eyes: Conjunctivae and EOM are normal. No scleral icterus.  Pupils are equal, round, and reactive to light.  Neck: Normal range of motion. Neck supple. No JVD present. No thyromegaly present.  Cardiovascular:  Normal rate, regular rhythm and normal heart sounds.  No murmur heard. No BLE edema. Pulmonary/Chest: Effort normal and breath sounds normal. No respiratory distress. Abdominal: Soft. Bowel sounds are normal, no distension. There is no tenderness. No masses. Musculoskeletal: Normal range of motion, no joint effusions. No gross deformities Neurological: Pt is alert and oriented to person, place, and time. No cranial nerve deficit. Coordination, balance, strength, speech and gait are normal.  Skin: Skin is warm and dry. No rash noted. No erythema.  Psychiatric: Patient has a normal mood and affect. behavior is normal. Judgment  and thought content normal.  No results found for this or any previous visit (from the past 72 hour(s)).   PHQ2/9: Depression screen [facility] 2/9 08/17/2019 12/10/2018 08/13/2018 06/04/2018 06/04/2018  Decreased Interest 0 0 0 0 0  Down, Depressed, Hopeless 0 0 0 0 0  PHQ - 2 Score 0 0 0 0 0  Altered sleeping 0 0 0 1 -  Tired, decreased energy 1 0 0 1 -  Change in appetite 0 0 0 0 -  Feeling bad or failure about yourself  0 0 0 0 -  Trouble concentrating 0 0 0 0 -  Moving slowly or fidgety/restless 0 0 0 0 -  Suicidal thoughts 0 0 0 0 -  PHQ-9 Score 1 0 0 2 -  Difficult doing work/chores Not difficult at all Not difficult at all Not difficult at all Not difficult at all -   PHQ-2/9 Result is negative.    Fall Risk: Fall Risk  08/17/2019 06/18/2019 05/05/2019 04/21/2019 03/11/2019  Falls in the past year? 0 0 0 0 0  Number falls in past yr: 0 - - - -  Injury with Fall? 0 - - - -  Follow up Falls evaluation completed - - - -    Assessment & Plan  1. Belching - famotidine (PEPCID) 40 MG tablet; Take 1 tablet (40 mg total) by mouth daily.  Dispense: 90 tablet; Refill: 1 - simethicone (GAS-X) 80 MG chewable tablet; Chew 1 tablet (80 mg total) by mouth 3 (three) times daily as needed for flatulence.  Dispense: 90 tablet; Refill: 1  2. Heartburn - Diet discussed in  detial. - famotidine (PEPCID) 40 MG tablet; Take 1 tablet (40 mg total) by mouth daily.  Dispense: 90 tablet; Refill: 1 - simethicone (GAS-X) 80 MG chewable tablet; Chew 1 tablet (80 mg total) by mouth 3 (three) times daily as needed for flatulence.  Dispense: 90 tablet; Refill: 1  3. Plantar fasciitis, left - Ice, rest, stretching, NSAID  4. Elevated blood pressure reading - Comprehensive metabolic panel  5. Hand or foot spasms - CBC with Differential/Platelet - Magnesium - Comprehensive metabolic panel - Iron, TIBC and Ferritin Panel - B12 and Folate Panel  6. Class 2 obesity due to excess calories without serious comorbidity with body mass index (BMI) of 37.0 to 37.9 in adult - Discussed importance of 150 minutes of physical activity weekly, eat two servings of fish weekly, eat one serving of tree nuts ( cashews, pistachios, pecans, almonds.Marland Kitchen) every other day, eat 6 servings of fruit/vegetables daily and drink plenty of water and avoid sweet beverages.   7. Anemia, unspecified type - CBC with Differential/Platelet  8. Fatigue, unspecified type - CBC with Differential/Platelet - Magnesium - Lipid panel - Insulin, random - TSH - Comprehensive metabolic panel - Iron, TIBC and Ferritin Panel - VITAMIN D 25 Hydroxy (Vit-D Deficiency, Fractures) - B12 and Folate Panel - Hepatitis C antibody  9. Need for hepatitis C screening test - Hepatitis C antibody

## 2019-08-17 NOTE — Patient Instructions (Addendum)
DASH Eating Plan DASH stands for "Dietary Approaches to Stop Hypertension." The DASH eating plan is a healthy eating plan that has been shown to reduce high blood pressure (hypertension). It may also reduce your risk for type 2 diabetes, heart disease, and stroke. The DASH eating plan may also help with weight loss. What are tips for following this plan?  General guidelines  Avoid eating more than 2,300 mg (milligrams) of salt (sodium) a day. If you have hypertension, you may need to reduce your sodium intake to 1,500 mg a day.  Limit alcohol intake to no more than 1 drink a day for nonpregnant women and 2 drinks a day for men. One drink equals 12 oz of beer, 5 oz of wine, or 1 oz of hard liquor.  Work with your health care provider to maintain a healthy body weight or to lose weight. Ask what an ideal weight is for you.  Get at least 30 minutes of exercise that causes your heart to beat faster (aerobic exercise) most days of the week. Activities may include walking, swimming, or biking.  Work with your health care provider or diet and nutrition specialist (dietitian) to adjust your eating plan to your individual calorie needs. Reading food labels   Check food labels for the amount of sodium per serving. Choose foods with less than 5 percent of the Daily Value of sodium. Generally, foods with less than 300 mg of sodium per serving fit into this eating plan.  To find whole grains, look for the word "whole" as the first word in the ingredient list. Shopping  Buy products labeled as "low-sodium" or "no salt added."  Buy fresh foods. Avoid canned foods and premade or frozen meals. Cooking  Avoid adding salt when cooking. Use salt-free seasonings or herbs instead of table salt or sea salt. Check with your health care provider or pharmacist before using salt substitutes.  Do not fry foods. Cook foods using healthy methods such as baking, boiling, grilling, and broiling instead.  Cook with  heart-healthy oils, such as olive, canola, soybean, or sunflower oil. Meal planning  Eat a balanced diet that includes: ? 5 or more servings of fruits and vegetables each day. At each meal, try to fill half of your plate with fruits and vegetables. ? Up to 6-8 servings of whole grains each day. ? Less than 6 oz of lean meat, poultry, or fish each day. A 3-oz serving of meat is about the same size as a deck of cards. One egg equals 1 oz. ? 2 servings of low-fat dairy each day. ? A serving of nuts, seeds, or beans 5 times each week. ? Heart-healthy fats. Healthy fats called Omega-3 fatty acids are found in foods such as flaxseeds and coldwater fish, like sardines, salmon, and mackerel.  Limit how much you eat of the following: ? Canned or prepackaged foods. ? Food that is high in trans fat, such as fried foods. ? Food that is high in saturated fat, such as fatty meat. ? Sweets, desserts, sugary drinks, and other foods with added sugar. ? Full-fat dairy products.  Do not salt foods before eating.  Try to eat at least 2 vegetarian meals each week.  Eat more home-cooked food and less restaurant, buffet, and fast food.  When eating at a restaurant, ask that your food be prepared with less salt or no salt, if possible. What foods are recommended? The items listed may not be a complete list. Talk with your dietitian about   what dietary choices are best for you. Grains Whole-grain or whole-wheat bread. Whole-grain or whole-wheat pasta. Brown rice. Oatmeal. Quinoa. Bulgur. Whole-grain and low-sodium cereals. Pita bread. Low-fat, low-sodium crackers. Whole-wheat flour tortillas. Vegetables Fresh or frozen vegetables (raw, steamed, roasted, or grilled). Low-sodium or reduced-sodium tomato and vegetable juice. Low-sodium or reduced-sodium tomato sauce and tomato paste. Low-sodium or reduced-sodium canned vegetables. Fruits All fresh, dried, or frozen fruit. Canned fruit in natural juice (without  added sugar). Meat and other protein foods Skinless chicken or turkey. Ground chicken or turkey. Pork with fat trimmed off. Fish and seafood. Egg whites. Dried beans, peas, or lentils. Unsalted nuts, nut butters, and seeds. Unsalted canned beans. Lean cuts of beef with fat trimmed off. Low-sodium, lean deli meat. Dairy Low-fat (1%) or fat-free (skim) milk. Fat-free, low-fat, or reduced-fat cheeses. Nonfat, low-sodium ricotta or cottage cheese. Low-fat or nonfat yogurt. Low-fat, low-sodium cheese. Fats and oils Soft margarine without trans fats. Vegetable oil. Low-fat, reduced-fat, or light mayonnaise and salad dressings (reduced-sodium). Canola, safflower, olive, soybean, and sunflower oils. Avocado. Seasoning and other foods Herbs. Spices. Seasoning mixes without salt. Unsalted popcorn and pretzels. Fat-free sweets. What foods are not recommended? The items listed may not be a complete list. Talk with your dietitian about what dietary choices are best for you. Grains Baked goods made with fat, such as croissants, muffins, or some breads. Dry pasta or rice meal packs. Vegetables Creamed or fried vegetables. Vegetables in a cheese sauce. Regular canned vegetables (not low-sodium or reduced-sodium). Regular canned tomato sauce and paste (not low-sodium or reduced-sodium). Regular tomato and vegetable juice (not low-sodium or reduced-sodium). Pickles. Olives. Fruits Canned fruit in a light or heavy syrup. Fried fruit. Fruit in cream or butter sauce. Meat and other protein foods Fatty cuts of meat. Ribs. Fried meat. Bacon. Sausage. Bologna and other processed lunch meats. Salami. Fatback. Hotdogs. Bratwurst. Salted nuts and seeds. Canned beans with added salt. Canned or smoked fish. Whole eggs or egg yolks. Chicken or turkey with skin. Dairy Whole or 2% milk, cream, and half-and-half. Whole or full-fat cream cheese. Whole-fat or sweetened yogurt. Full-fat cheese. Nondairy creamers. Whipped toppings.  Processed cheese and cheese spreads. Fats and oils Butter. Stick margarine. Lard. Shortening. Ghee. Bacon fat. Tropical oils, such as coconut, palm kernel, or palm oil. Seasoning and other foods Salted popcorn and pretzels. Onion salt, garlic salt, seasoned salt, table salt, and sea salt. Worcestershire sauce. Tartar sauce. Barbecue sauce. Teriyaki sauce. Soy sauce, including reduced-sodium. Steak sauce. Canned and packaged gravies. Fish sauce. Oyster sauce. Cocktail sauce. Horseradish that you find on the shelf. Ketchup. Mustard. Meat flavorings and tenderizers. Bouillon cubes. Hot sauce and Tabasco sauce. Premade or packaged marinades. Premade or packaged taco seasonings. Relishes. Regular salad dressings. Where to find more information:  National Heart, Lung, and Blood Institute: www.nhlbi.nih.gov  American Heart Association: www.heart.org Summary  The DASH eating plan is a healthy eating plan that has been shown to reduce high blood pressure (hypertension). It may also reduce your risk for type 2 diabetes, heart disease, and stroke.  With the DASH eating plan, you should limit salt (sodium) intake to 2,300 mg a day. If you have hypertension, you may need to reduce your sodium intake to 1,500 mg a day.  When on the DASH eating plan, aim to eat more fresh fruits and vegetables, whole grains, lean proteins, low-fat dairy, and heart-healthy fats.  Work with your health care provider or diet and nutrition specialist (dietitian) to adjust your eating plan to your   individual calorie needs. This information is not intended to replace advice given to you by your health care provider. Make sure you discuss any questions you have with your health care provider. Document Released: 08/29/2011 Document Revised: 08/22/2017 Document Reviewed: 09/02/2016 Elsevier Patient Education  2020 [facility] When you have gastroesophageal reflux disease  (GERD), the foods you eat and your eating habits are very important. Choosing the right foods can help ease your discomfort. Think about working with a nutrition specialist (dietitian) to help you make good choices. What are tips for following this plan?  Meals  Choose healthy foods that are low in fat, such as fruits, vegetables, whole grains, low-fat dairy products, and lean meat, fish, and poultry.  Eat small meals often instead of 3 large meals a day. Eat your meals slowly, and in a place where you are relaxed. Avoid bending over or lying down until 2-3 hours after eating.  Avoid eating meals 2-3 hours before bed.  Avoid drinking a lot of liquid with meals.  Cook foods using methods other than frying. Bake, grill, or broil food instead.  Avoid or limit: ? Chocolate. ? Peppermint or spearmint. ? Alcohol. ? Pepper. ? Black and decaffeinated coffee. ? Black and decaffeinated tea. ? Bubbly (carbonated) soft drinks. ? Caffeinated energy drinks and soft drinks.  Limit high-fat foods such as: ? Fatty meat or fried foods. ? Whole milk, cream, butter, or ice cream. ? Nuts and nut butters. ? Pastries, donuts, and sweets made with butter or shortening.  Avoid foods that cause symptoms. These foods may be different for everyone. Common foods that cause symptoms include: ? Tomatoes. ? Oranges, lemons, and limes. ? Peppers. ? Spicy food. ? Onions and garlic. ? Vinegar. Lifestyle  Maintain a healthy weight. Ask your doctor what weight is healthy for you. If you need to lose weight, work with your doctor to do so safely.  Exercise for at least 30 minutes for 5 or more days each week, or as told by your doctor.  Wear loose-fitting clothes.  Do not smoke. If you need help quitting, ask your doctor.  Sleep with the head of your bed higher than your feet. Use a wedge under the mattress or blocks under the bed frame to raise the head of the bed. Summary  When you have  gastroesophageal reflux disease (GERD), food and lifestyle choices are very important in easing your symptoms.  Eat small meals often instead of 3 large meals a day. Eat your meals slowly, and in a place where you are relaxed.  Limit high-fat foods such as fatty meat or fried foods.  Avoid bending over or lying down until 2-3 hours after eating.  Avoid peppermint and spearmint, caffeine, alcohol, and chocolate. This information is not intended to replace advice given to you by your health care provider. Make sure you discuss any questions you have with your health care provider. Document Released: 03/10/2012 Document Revised: 12/31/2018 Document Reviewed: 10/15/2016 Elsevier Patient Education  2020 [place] with rolling a frozen waterbottle over the heel for 10-20 minutes twice a day.  Plantar Fasciitis Rehab Ask your health care provider which exercises are safe for you. Do exercises exactly as told by your health care provider and adjust them as directed. It is normal to feel mild stretching, pulling, tightness, or discomfort as you do these exercises. Stop right away if you feel sudden pain or your pain gets worse. Do  not begin these exercises until told by your health care provider. Stretching and range-of-motion exercises These exercises warm up your muscles and joints and improve the movement and flexibility of your foot. These exercises also help to relieve pain. Plantar fascia stretch  1. Sit with your left / right leg crossed over your opposite knee. 2. Hold your heel with one hand with that thumb near your arch. With your other hand, hold your toes and gently pull them back toward the top of your foot. You should feel a stretch on the bottom of your toes or your foot (plantar fascia) or both. 3. Hold this stretch for__________ seconds. 4. Slowly release your toes and return to the starting position. Repeat __________ times. Complete this exercise __________ times a  day. Gastrocnemius stretch, standing This exercise is also called a calf (gastroc) stretch. It stretches the muscles in the back of the upper calf. 1. Stand with your hands against a wall. 2. Extend your left / right leg behind you, and bend your front knee slightly. 3. Keeping your heels on the floor and your back knee straight, shift your weight toward the wall. Do not arch your back. You should feel a gentle stretch in your upper left / right calf. 4. Hold this position for __________ seconds. Repeat __________ times. Complete this exercise __________ times a day. Soleus stretch, standing This exercise is also called a calf (soleus) stretch. It stretches the muscles in the back of the lower calf. 1. Stand with your hands against a wall. 2. Extend your left / right leg behind you, and bend your front knee slightly. 3. Keeping your heels on the floor, bend your back knee and shift your weight slightly over your back leg. You should feel a gentle stretch deep in your lower calf. 4. Hold this position for __________ seconds. Repeat __________ times. Complete this exercise __________ times a day. Gastroc and soleus stretch, standing step This exercise stretches the muscles in the back of the lower leg. These muscles are in the upper calf (gastrocnemius) and the lower calf (soleus). 1. Stand with the ball of your left / right foot on a step. The ball of your foot is on the walking surface, right under your toes. 2. Keep your other foot firmly on the same step. 3. Hold on to the wall or a railing for balance. 4. Slowly lift your other foot, allowing your body weight to press your left / right heel down over the edge of the step. You should feel a stretch in your left / right calf. 5. Hold this position for __________ seconds. 6. Return both feet to the step. 7. Repeat this exercise with a slight bend in your left / right knee. Repeat __________ times with your left / right knee straight and  __________ times with your left / right knee bent. Complete this exercise __________ times a day. Balance exercise This exercise builds your balance and strength control of your arch to help take pressure off your plantar fascia. Single leg stand If this exercise is too easy, you can try it with your eyes closed or while standing on a pillow. 1. Without shoes, stand near a railing or in a doorway. You may hold on to the railing or door frame as needed. 2. Stand on your left / right foot. Keep your big toe down on the floor and try to keep your arch lifted. Do not let your foot roll inward. 3. Hold this position for __________  seconds. Repeat __________ times. Complete this exercise __________ times a day. This information is not intended to replace advice given to you by your health care provider. Make sure you discuss any questions you have with your health care provider. Document Released: 09/09/2005 Document Revised: 12/31/2018 Document Reviewed: 07/08/2018 Elsevier Patient Education  2020 [employer].

## 2019-08-19 LAB — COMPREHENSIVE METABOLIC PANEL
ALT: 10 IU/L (ref 0–32)
AST: 14 IU/L (ref 0–40)
Albumin/Globulin Ratio: 1.8 (ref 1.2–2.2)
Albumin: 4.2 g/dL (ref 3.8–4.9)
Alkaline Phosphatase: 127 IU/L — ABNORMAL HIGH (ref 39–117)
BUN/Creatinine Ratio: 12 (ref 9–23)
BUN: 9 mg/dL (ref 6–24)
Bilirubin Total: 0.4 mg/dL (ref 0.0–1.2)
CO2: 22 mmol/L (ref 20–29)
Calcium: 9 mg/dL (ref 8.7–10.2)
Chloride: 105 mmol/L (ref 96–106)
Creatinine, Ser: 0.76 mg/dL (ref 0.57–1.00)
GFR calc Af Amer: 105 mL/min/{1.73_m2} (ref >59)
GFR calc non Af Amer: 91 mL/min/{1.73_m2} (ref >59)
Globulin, Total: 2.3 g/dL (ref 1.5–4.5)
Glucose: 98 mg/dL (ref 65–99)
Potassium: 4.1 mmol/L (ref 3.5–5.2)
Sodium: 142 mmol/L (ref 134–144)
Total Protein: 6.5 g/dL (ref 6.0–8.5)

## 2019-08-19 LAB — CBC WITH DIFFERENTIAL/PLATELET
Basophils Absolute: 0 10*3/uL (ref 0.0–0.2)
Basos: 1 %
EOS (ABSOLUTE): 0.1 10*3/uL (ref 0.0–0.4)
Eos: 3 %
Hematocrit: 34.6 % (ref 34.0–46.6)
Hemoglobin: 11.1 g/dL (ref 11.1–15.9)
Immature Grans (Abs): 0 10*3/uL (ref 0.0–0.1)
Immature Granulocytes: 0 %
Lymphocytes Absolute: 1.8 10*3/uL (ref 0.7–3.1)
Lymphs: 39 %
MCH: 25.3 pg — ABNORMAL LOW (ref 26.6–33.0)
MCHC: 32.1 g/dL (ref 31.5–35.7)
MCV: 79 fL (ref 79–97)
Monocytes Absolute: 0.6 10*3/uL (ref 0.1–0.9)
Monocytes: 12 %
Neutrophils Absolute: 2 10*3/uL (ref 1.4–7.0)
Neutrophils: 45 %
Platelets: 372 10*3/uL (ref 150–450)
RBC: 4.39 x10E6/uL (ref 3.77–5.28)
RDW: 14.1 % (ref 11.7–15.4)
WBC: 4.5 10*3/uL (ref 3.4–10.8)

## 2019-08-19 LAB — VITAMIN D 25 HYDROXY (VIT D DEFICIENCY, FRACTURES): Vit D, 25-Hydroxy: 28.7 ng/mL — ABNORMAL LOW (ref 30.0–100.0)

## 2019-08-19 LAB — IRON,TIBC AND FERRITIN PANEL
Ferritin: 18 ng/mL (ref 15–150)
Iron Saturation: 14 % — ABNORMAL LOW (ref 15–55)
Iron: 49 ug/dL (ref 27–159)
Total Iron Binding Capacity: 339 ug/dL (ref 250–450)
UIBC: 290 ug/dL (ref 131–425)

## 2019-08-19 LAB — TSH: TSH: 9.31 u[IU]/mL — ABNORMAL HIGH (ref 0.450–4.500)

## 2019-08-19 LAB — B12 AND FOLATE PANEL
Folate: 4.5 ng/mL (ref >3.0)
Vitamin B-12: 622 pg/mL (ref 232–1245)

## 2019-08-19 LAB — LIPID PANEL
Chol/HDL Ratio: 3.2 ratio (ref 0.0–4.4)
Cholesterol, Total: 181 mg/dL (ref 100–199)
HDL: 56 mg/dL (ref >39)
LDL Chol Calc (NIH): 109 mg/dL — ABNORMAL HIGH (ref 0–99)
Triglycerides: 89 mg/dL (ref 0–149)
VLDL Cholesterol Cal: 16 mg/dL (ref 5–40)

## 2019-08-19 LAB — INSULIN, RANDOM: INSULIN: 17.3 u[IU]/mL (ref 2.6–24.9)

## 2019-08-19 LAB — MAGNESIUM: Magnesium: 2 mg/dL (ref 1.6–2.3)

## 2019-08-19 LAB — HEPATITIS C ANTIBODY: Hep C Virus Ab: <0.1 s/co ratio (ref 0.0–0.9)

## 2019-08-20 ENCOUNTER — Other Ambulatory Visit: Payer: Self-pay | Admitting: Family Medicine

## 2019-08-20 DIAGNOSIS — R7989 Other specified abnormal findings of blood chemistry: Secondary | ICD-10-CM | POA: Insufficient documentation

## 2019-08-20 DIAGNOSIS — E559 Vitamin D deficiency, unspecified: Secondary | ICD-10-CM | POA: Insufficient documentation

## 2019-08-26 ENCOUNTER — Telehealth: Payer: Self-pay | Admitting: *Deleted

## 2019-08-26 NOTE — Telephone Encounter (Signed)
Pt given results per Raelyn Ensign, "CBC is normal, Magnesium normal, Hep C negative, B12 and folate are normal  - Cholesterol shows mild elevation in LDL - no medication at this point, just low cholesterol diet to help; HDL and triglycerides look great.  - Fasting insulin level is a bit elevated, but blood sugar is normal. There is some insulin resistance present, but unlikely to be diabetes or prediabetes yet. Work on diet and exercise to help this.  - CMP is normal except for mildly elevated alkaline phosphatase - other liver functions are normal - we will recheck at next follow up.  - Iron levels show low end of normal ferritin, iron saturation is a bit low. May take OTC Iron 325mg  once daily to help with this.  - Vitamin D level is just slightly low - take 2000IU daily for 3 months, then take 1000IU once daily.  - TSH is elevated at 9.31. I want her to come in to repeat in 6 weeks. If still elevated, we will start once daily synthroid to help regulate these levels."; she verbalized understanding, and confirmed her 09/07/2019 appointment at [facility].

## 2019-09-07 ENCOUNTER — Ambulatory Visit: Payer: Managed Care, Other (non HMO) | Admitting: Family Medicine

## 2019-09-07 ENCOUNTER — Other Ambulatory Visit: Payer: Self-pay

## 2019-09-07 ENCOUNTER — Encounter: Payer: Self-pay | Admitting: Family Medicine

## 2019-09-07 VITALS — BP 142/86 | HR 74 | Temp 97.1°F | Resp 16 | Ht 65.0 in | Wt 230.5 lb

## 2019-09-07 DIAGNOSIS — R030 Elevated blood-pressure reading, without diagnosis of hypertension: Secondary | ICD-10-CM

## 2019-09-07 DIAGNOSIS — Z800 Family history of malignant neoplasm of digestive organs: Secondary | ICD-10-CM

## 2019-09-07 NOTE — Progress Notes (Signed)
Name: Kristine Mcconnell   MRN: [id number]    DOB: [birth year]   Date:09/07/2019       Progress Note  Subjective  Chief Complaint  Chief Complaint  Patient presents with  . Hypertension    follow up    HPI  Elevated BP:  She bought a wrist cuff at home and notes that she was getting readings 171/101, 180's/100's 2-3 weeks ago; lately she has been seeing ongoing elevated pressures in 140-150/90-100 range.  Our manual BP today shows 142/86 - discussed appropriate fit, that the wrist cuffs often run a bit higher than a manual cuff.  Denies headaches, blurred vision, BLE edema, chest pain, shortness of breath.  Discussed HCTZ 12.5mg  daily vs. DASH diet and exercise alone.  She wants to avoid medication if possible.   Family history CRC: PGM with history CRC, Dad and Paternal uncle with precancerous polyps.  She had colonoscopy last year that was normal.  She had been doing hemoccult annually - we will order for her today through [facility].  No changes in BM's - no blood in stool, dark and tarry stool, mucus in stool, or constipation/diarrhea.   Patient Active Problem List   Diagnosis Date Noted  . Elevated TSH 08/20/2019  . Vitamin D deficiency 08/20/2019  . Sebaceous cyst 03/14/2019  . 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 Obesity (BMI 35.0-39.9 without comorbidity) 06/10/2016  . Light headedness 10/26/2015  . Right ankle pain 10/26/2015    Past Surgical History:  Procedure Laterality Date  . BREAST CYST ASPIRATION Left 1990   benign  . COLONOSCOPY WITH PROPOFOL N/A 08/03/2018   Procedure: COLONOSCOPY WITH PROPOFOL;  Surgeon: Lin Landsman, MD;  [facility] ENDOSCOPY;  Service: Gastroenterology;  Laterality: N/A;  . DILATATION & CURETTAGE/HYSTEROSCOPY WITH MYOSURE N/A 06/09/2017   Procedure: DILATATION & CURETTAGE/HYSTEROSCOPY WITH MYOSURE RESECTION OF FIBROID;  Surgeon: Schermerhorn, Gwen Her, MD;  Location: [facility] ORS;  Service: Gynecology;  Laterality: N/A;  . DILATION AND CURETTAGE  OF UTERUS    . FOOT SURGERY Right 2011  . HYSTEROSCOPY WITH NOVASURE N/A 06/09/2017   Procedure: HYSTEROSCOPY WITH NOVASURE;  Surgeon: Schermerhorn, Gwen Her, MD;  Location: [facility] ORS;  Service: Gynecology;  Laterality: N/A;  . lapband  more than 10 years ago  . MASS EXCISION Right 03/17/2019   Procedure: EXCISION OF RIGHT AXILLARY LEISION;  Surgeon: Vickie Epley, MD;  Location: [facility];  Service: Vascular;  Laterality: Right;  . TONSILLECTOMY  1977    Family History  Problem Relation Age of Onset  . Diabetes Mother   . Hypertension Mother   . Hypertension Father   . Breast cancer Neg Hx   . Colon cancer Neg Hx     Social History   Socioeconomic History  . Marital status: Single    Spouse name: Not on file  . Number of children: 1  . Years of education: Not on file  . Highest education level: Not on file  Occupational History  . Occupation: Facilities manager: [employer]  Tobacco Use  . Smoking status: Never Smoker  . Smokeless tobacco: Never Used  Substance and Sexual Activity  . Alcohol use: Yes    Alcohol/week: 1.0 standard drinks    Types: 1 Glasses of wine per week    Comment: OCC  . Drug use: No  . Sexual activity: Yes    Partners: Male  Other Topics Concern  . Not on file  Social History Narrative  . Not on  file   Social Determinants of Health   Financial Resource Strain:   . Difficulty of Paying Living Expenses: Not on file  Food Insecurity:   . Worried About Charity fundraiser in the Last Year: Not on file  . Ran Out of Food in the Last Year: Not on file  Transportation Needs:   . Lack of Transportation (Medical): Not on file  . Lack of Transportation (Non-Medical): Not on file  Physical Activity:   . Days of Exercise per Week: Not on file  . Minutes of Exercise per Session: Not on file  Stress:   . Feeling of Stress : Not on file  Social Connections:   . Frequency of Communication with Friends and Family: Not on file  .  Frequency of Social Gatherings with Friends and Family: Not on file  . Attends Religious Services: Not on file  . Active Member of Clubs or Organizations: Not on file  . Attends Archivist Meetings: Not on file  . Marital Status: Not on file  Intimate Partner Violence:   . Fear of Current or Ex-Partner: Not on file  . Emotionally Abused: Not on file  . Physically Abused: Not on file  . Sexually Abused: Not on file     Current Outpatient Medications:  .  famotidine (PEPCID) 40 MG tablet, Take 1 tablet (40 mg total) by mouth daily., Disp: 90 tablet, Rfl: 1 .  ferrous sulfate 325 (65 FE) MG tablet, Take 325 mg by mouth daily with breakfast., Disp: , Rfl:  .  simethicone (GAS-X) 80 MG chewable tablet, Chew 1 tablet (80 mg total) by mouth 3 (three) times daily as needed for flatulence., Disp: 90 tablet, Rfl: 1 .  VITAMIN D PO, Take by mouth., Disp: , Rfl:   Allergies  Allergen Reactions  . Other     BAND-AIDS IRRITATE SKIN-PT PREFERS TAPE    I personally reviewed active problem list, medication list, allergies, notes from last encounter, lab results with the patient/caregiver today.   ROS  Constitutional: Negative for fever or weight change.  Respiratory: Negative for cough and shortness of breath.   Cardiovascular: Negative for chest pain or palpitations.  Gastrointestinal: Negative for abdominal pain, no bowel changes.  Musculoskeletal: Negative for gait problem or joint swelling.  Skin: Negative for rash.  Neurological: Negative for dizziness or headache.  No other specific complaints in a complete review of systems (except as listed in HPI above).  Objective  Vitals:   09/07/19 0758  BP: (!) 142/86  Pulse: 74  Resp: 16  Temp: (!) 97.1 F (36.2 C)  TempSrc: Temporal  SpO2: 97%  Weight: 230 lb 8 oz (104.6 kg)  Height: 5\' 5"  (1.651 m)   Body mass index is 38.36 kg/m.  Physical Exam  Constitutional: Patient appears well-developed and well-nourished. No  distress.  HENT: Head: Normocephalic and atraumatic. Eyes: Conjunctivae and EOM are normal. No scleral icterus.  Neck: Normal range of motion. Neck supple. No JVD present. Cardiovascular: Normal rate, regular rhythm and normal heart sounds.  No murmur heard. No BLE edema. Pulmonary/Chest: Effort normal and breath sounds normal. No respiratory distress. Musculoskeletal: Normal range of motion, no joint effusions. No gross deformities Neurological: Pt is alert and oriented to person, place, and time. No cranial nerve deficit. Coordination, balance, strength, speech and gait are normal.  Skin: Skin is warm and dry. No rash noted. No erythema.  Psychiatric: Patient has a normal mood and affect. behavior is normal. Judgment and thought  content normal.  No results found for this or any previous visit (from the past 72 hour(s)).   PHQ2/9: Depression screen [facility] 2/9 09/07/2019 08/17/2019 12/10/2018 08/13/2018 06/04/2018  Decreased Interest 0 0 0 0 0  Down, Depressed, Hopeless 0 0 0 0 0  PHQ - 2 Score 0 0 0 0 0  Altered sleeping 0 0 0 0 1  Tired, decreased energy 0 1 0 0 1  Change in appetite 0 0 0 0 0  Feeling bad or failure about yourself  0 0 0 0 0  Trouble concentrating 0 0 0 0 0  Moving slowly or fidgety/restless 0 0 0 0 0  Suicidal thoughts 0 0 0 0 0  PHQ-9 Score 0 1 0 0 2  Difficult doing work/chores Not difficult at all Not difficult at all Not difficult at all Not difficult at all Not difficult at all   PHQ-2/9 Result is negative.    Fall Risk: Fall Risk  09/07/2019 08/17/2019 06/18/2019 05/05/2019 04/21/2019  Falls in the past year? 0 0 0 0 0  Number falls in past yr: 0 0 - - -  Injury with Fall? 0 0 - - -  Follow up Falls evaluation completed Falls evaluation completed - - -   Assessment & Plan  1. Elevated blood pressure reading - DASH diet and exercise; recheck in 4 weeks, if still elevated will add low dose HCTZ.    2. Family history of colorectal cancer - Fecal occult  blood, imunochemical([facility])

## 2019-09-07 NOTE — Patient Instructions (Signed)

## 2019-09-29 ENCOUNTER — Telehealth: Payer: Self-pay | Admitting: Family Medicine

## 2019-09-29 NOTE — Telephone Encounter (Signed)
-----   Message from Hubbard Hartshorn, FNP sent at 08/20/2019  8:30 AM EST ----- Regarding: Please have patient come in for TSH recheck Please have patient come in for TSH recheck

## 2019-10-05 NOTE — Telephone Encounter (Signed)
Patient notified

## 2019-10-06 ENCOUNTER — Other Ambulatory Visit: Payer: Self-pay

## 2019-10-06 ENCOUNTER — Ambulatory Visit (INDEPENDENT_AMBULATORY_CARE_PROVIDER_SITE_OTHER): Payer: Managed Care, Other (non HMO) | Admitting: Family Medicine

## 2019-10-06 ENCOUNTER — Encounter: Payer: Self-pay | Admitting: Family Medicine

## 2019-10-06 VITALS — BP 126/78 | HR 71

## 2019-10-06 DIAGNOSIS — R7989 Other specified abnormal findings of blood chemistry: Secondary | ICD-10-CM | POA: Diagnosis not present

## 2019-10-06 DIAGNOSIS — R030 Elevated blood-pressure reading, without diagnosis of hypertension: Secondary | ICD-10-CM

## 2019-10-06 NOTE — Progress Notes (Signed)
Name: Kristine Mcconnell   MRN: [id number]    DOB: 1966/02/16   Date:10/06/2019       Progress Note  Subjective  Chief Complaint  Chief Complaint  Patient presents with  . Follow-up    I connected with  [name] on 10/06/19 at  7:40 AM EST by telephone and verified that I am speaking with the correct person using two identifiers.  I discussed the limitations, risks, security and privacy concerns of performing an evaluation and management service by telephone and the availability of in person appointments. Staff also discussed with the patient that there may be a patient responsible charge related to this service. Patient Location: Home Provider Location: Office Additional Individuals present: None  HPI  Pt presents for 4 week follow up on elevated BP reading.  Was mildly elevated at 142/86 on 09/07/2019, she has been following a lower sodium diet.  No HA, vision changes, CP/tightness, SOB, leg swelling.  She has not been checking BP at home as we had discussed.   Abnormal TSH: Had elevated TSH in November 2020, due for repeat today; no history of medication or thyroid issues in the past. She endorses decrease in energy level.  Patient denies constipation/diarrhea, palpitations, unintentional weight changes, hair/skin/nail changes.  Patient Active Problem List   Diagnosis Date Noted  . Elevated TSH 08/20/2019  . Vitamin D deficiency 08/20/2019  . Sebaceous cyst 03/14/2019  . 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 Obesity (BMI 35.0-39.9 without comorbidity) 06/10/2016  . Light headedness 10/26/2015  . Right ankle pain 10/26/2015    Past Surgical History:  Procedure Laterality Date  . BREAST CYST ASPIRATION Left 1990   benign  . COLONOSCOPY WITH PROPOFOL N/A 08/03/2018   Procedure: COLONOSCOPY WITH PROPOFOL;  Surgeon: Lin Landsman, MD;  Location: [facility] ENDOSCOPY;  Service: Gastroenterology;  Laterality: N/A;  . DILATATION & CURETTAGE/HYSTEROSCOPY WITH MYOSURE N/A 06/09/2017   Procedure: DILATATION & CURETTAGE/HYSTEROSCOPY WITH MYOSURE RESECTION OF FIBROID;  Surgeon: Schermerhorn, Gwen Her, MD;  Location: [facility] ORS;  Service: Gynecology;  Laterality: N/A;  . DILATION AND CURETTAGE OF UTERUS    . FOOT SURGERY Right 2011  . HYSTEROSCOPY WITH NOVASURE N/A 06/09/2017   Procedure: HYSTEROSCOPY WITH NOVASURE;  Surgeon: Schermerhorn, Gwen Her, MD;  Location: [facility] ORS;  Service: Gynecology;  Laterality: N/A;  . lapband  more than 10 years ago  . MASS EXCISION Right 03/17/2019   Procedure: EXCISION OF RIGHT AXILLARY LEISION;  Surgeon: Vickie Epley, MD;  Location: [place];  Service: Vascular;  Laterality: Right;  . TONSILLECTOMY  1977    Family History  Problem Relation Age of Onset  . Diabetes Mother   . Hypertension Mother   . Hypertension Father   . Breast cancer Neg Hx   . Colon cancer Neg Hx     Social History   Socioeconomic History  . Marital status: Single    Spouse name: Not on file  . Number of children: 1  . Years of education: Not on file  . Highest education level: Not on file  Occupational History  . Occupation: Facilities manager: [employer]  Tobacco Use  . Smoking status: Never Smoker  . Smokeless tobacco: Never Used  Substance and Sexual Activity  . Alcohol use: Yes    Alcohol/week: 1.0 standard drinks    Types: 1 Glasses of wine per week    Comment: OCC  . Drug use: No  . Sexual activity: Yes    Partners:  Male  Other Topics Concern  . Not on file  Social History Narrative  . Not on file   Social Determinants of Health   Financial Resource Strain:   . Difficulty of Paying Living Expenses: Not on file  Food Insecurity:   . Worried About Charity fundraiser in the Last Year: Not on file  . Ran Out of Food in the Last Year: Not on file  Transportation Needs:   . Lack of Transportation (Medical): Not on file  . Lack of Transportation (Non-Medical): Not on file  Physical Activity:   . Days of Exercise per Week:  Not on file  . Minutes of Exercise per Session: Not on file  Stress:   . Feeling of Stress : Not on file  Social Connections:   . Frequency of Communication with Friends and Family: Not on file  . Frequency of Social Gatherings with Friends and Family: Not on file  . Attends Religious Services: Not on file  . Active Member of Clubs or Organizations: Not on file  . Attends Archivist Meetings: Not on file  . Marital Status: Not on file  Intimate Partner Violence:   . Fear of Current or Ex-Partner: Not on file  . Emotionally Abused: Not on file  . Physically Abused: Not on file  . Sexually Abused: Not on file     Current Outpatient Medications:  .  famotidine (PEPCID) 40 MG tablet, Take 1 tablet (40 mg total) by mouth daily., Disp: 90 tablet, Rfl: 1 .  simethicone (GAS-X) 80 MG chewable tablet, Chew 1 tablet (80 mg total) by mouth 3 (three) times daily as needed for flatulence., Disp: 90 tablet, Rfl: 1 .  VITAMIN D PO, Take by mouth., Disp: , Rfl:  .  ferrous sulfate 325 (65 FE) MG tablet, Take 325 mg by mouth daily with breakfast., Disp: , Rfl:   Allergies  Allergen Reactions  . Other     BAND-AIDS IRRITATE SKIN-PT PREFERS TAPE    I personally reviewed active problem list, medication list, allergies, health maintenance, notes from last encounter, lab results with the patient/caregiver today.   ROS  Constitutional: Negative for fever or weight change.  Respiratory: Negative for cough and shortness of breath.   Cardiovascular: Negative for chest pain or palpitations.  Gastrointestinal: Negative for abdominal pain, no bowel changes.  Musculoskeletal: Negative for gait problem or joint swelling.  Skin: Negative for rash.  Neurological: Negative for dizziness or headache.  No other specific complaints in a complete review of systems (except as listed in HPI above).  Objective  Virtual encounter, vitals not obtained.  There is no height or weight on file to  calculate BMI.  Physical Exam  Constitutional: Patient appears well-developed and well-nourished. No distress.  HENT: Head: Normocephalic and atraumatic.  Neck: Normal range of motion. Pulmonary/Chest: Effort normal. No respiratory distress. Speaking in complete sentences Neurological: Pt is alert and oriented to person, place, and time.  Speech is normal Psychiatric: Patient has a normal mood and affect. behavior is normal. Judgment and thought content normal.  No results found for this or any previous visit (from the past 72 hour(s)).  PHQ2/9: Depression screen [facility] 2/9 10/06/2019 09/07/2019 08/17/2019 12/10/2018 08/13/2018  Decreased Interest 0 0 0 0 0  Down, Depressed, Hopeless 0 0 0 0 0  PHQ - 2 Score 0 0 0 0 0  Altered sleeping 0 0 0 0 0  Tired, decreased energy 0 0 1 0 0  Change in  appetite 0 0 0 0 0  Feeling bad or failure about yourself  0 0 0 0 0  Trouble concentrating 0 0 0 0 0  Moving slowly or fidgety/restless 0 0 0 0 0  Suicidal thoughts 0 0 0 0 0  PHQ-9 Score 0 0 1 0 0  Difficult doing work/chores Not difficult at all Not difficult at all Not difficult at all Not difficult at all Not difficult at all   PHQ-2/9 Result is negative.    Fall Risk: Fall Risk  10/06/2019 09/07/2019 08/17/2019 06/18/2019 05/05/2019  Falls in the past year? 0 0 0 0 0  Number falls in past yr: 0 0 0 - -  Injury with Fall? 0 0 0 - -  Follow up Falls evaluation completed Falls evaluation completed Falls evaluation completed - -    Assessment & Plan  1. Elevated blood pressure reading - She will come in today for BP check, if elevated will add HCTZ. - Basic metabolic panel  2. Elevated TSH - Will start synthroid if still abnormal. - Thyroid Panel With TSH   I discussed the assessment and treatment plan with the patient. The patient was provided an opportunity to ask questions and all were answered. The patient agreed with the plan and demonstrated an understanding of the instructions.    The patient was advised to call back or seek an in-person evaluation if the symptoms worsen or if the condition fails to improve as anticipated.  I provided 12 minutes of non-face-to-face time during this encounter.  Hubbard Hartshorn, FNP

## 2019-10-21 LAB — THYROID PANEL WITH TSH
Free Thyroxine Index: 1.4 (ref 1.2–4.9)
T3 Uptake Ratio: 19 % — ABNORMAL LOW (ref 24–39)
T4, Total: 7.2 ug/dL (ref 4.5–12.0)
TSH: 3.94 u[IU]/mL (ref 0.450–4.500)

## 2019-10-21 LAB — BASIC METABOLIC PANEL
BUN/Creatinine Ratio: 14 (ref 9–23)
BUN: 10 mg/dL (ref 6–24)
CO2: 28 mmol/L (ref 20–29)
Calcium: 9.8 mg/dL (ref 8.7–10.2)
Chloride: 103 mmol/L (ref 96–106)
Creatinine, Ser: 0.73 mg/dL (ref 0.57–1.00)
GFR calc Af Amer: 110 mL/min/{1.73_m2} (ref >59)
GFR calc non Af Amer: 96 mL/min/{1.73_m2} (ref >59)
Glucose: 94 mg/dL (ref 65–99)
Potassium: 3.9 mmol/L (ref 3.5–5.2)
Sodium: 141 mmol/L (ref 134–144)

## 2020-03-14 ENCOUNTER — Emergency Department
Admission: EM | Admit: 2020-03-14 | Discharge: 2020-03-14 | Disposition: A | Discharge status: Home / Self Care | Payer: Managed Care, Other (non HMO) | Attending: Emergency Medicine | Admitting: Emergency Medicine

## 2020-03-14 ENCOUNTER — Ambulatory Visit: Payer: Self-pay

## 2020-03-14 ENCOUNTER — Other Ambulatory Visit: Payer: Self-pay

## 2020-03-14 ENCOUNTER — Emergency Department: Payer: Managed Care, Other (non HMO)

## 2020-03-14 DIAGNOSIS — I10 Essential (primary) hypertension: Secondary | ICD-10-CM | POA: Diagnosis not present

## 2020-03-14 DIAGNOSIS — Z79899 Other long term (current) drug therapy: Secondary | ICD-10-CM | POA: Diagnosis not present

## 2020-03-14 DIAGNOSIS — R519 Headache, unspecified: Secondary | ICD-10-CM | POA: Diagnosis present

## 2020-03-14 MED ORDER — AMLODIPINE BESY-BENAZEPRIL HCL 5-10 MG PO CAPS: 1.0000 | ORAL_CAPSULE | Freq: Every day | ORAL | 1 refills | Status: DC

## 2020-03-14 MED ORDER — KETOROLAC TROMETHAMINE 30 MG/ML IJ SOLN
30.0000 mg | Freq: Once | INTRAMUSCULAR | Status: AC
  Administered 2020-03-14: 30 mg via INTRAMUSCULAR
  Filled 2020-03-14: qty 1

## 2020-03-14 MED ORDER — AMLODIPINE BESYLATE 5 MG PO TABS
5.0000 mg | ORAL_TABLET | Freq: Once | ORAL | Status: AC
  Administered 2020-03-14: 5 mg via ORAL
  Filled 2020-03-14: qty 1

## 2020-03-14 MED ORDER — BUTALBITAL-APAP-CAFFEINE 50-325-40 MG PO TABS: 1.0000 | ORAL_TABLET | Freq: Four times a day (QID) | ORAL | 0 refills | Status: DC | PRN

## 2020-03-14 MED ORDER — BENAZEPRIL HCL 10 MG PO TABS
10.0000 mg | ORAL_TABLET | Freq: Every day | ORAL | Status: DC
  Administered 2020-03-14: 10 mg via ORAL
  Filled 2020-03-14: qty 1

## 2020-03-14 NOTE — ED Triage Notes (Signed)
Pt c/o intermittently stabbing right sided head ache that started yesterday and states her b/p was elevated today and denies a  hx of HTN , pt is a/ox4, in NAD.

## 2020-03-14 NOTE — ED Notes (Signed)
Pt otf for CT

## 2020-03-14 NOTE — ED Notes (Signed)
Pt ambulatory from lobby with steady gait, NAD noted. Pt reports sharp pains that come and go in the right side of her head since yesterday accompanied by high BP, no hx HTN and does not take HTN meds. Pt A&Ox4.

## 2020-03-14 NOTE — ED Notes (Signed)
Repeat bp 211/86

## 2020-03-14 NOTE — Telephone Encounter (Signed)
Pt c/o intermittent (5 second) stabbing pain to left side of head. Pt stated that the stabbing pain occasionally radiates to her ear or her eye or above left eye brow. Pt stated onset was 2 pm yesterday. Pt tried few doses of Tylenol with no relief.  Pt stated there is no underlying ache, just the intermittent stabbing pain.  Denies neurological sx (no facial arm or leg weakness or numbness), her speech is clear with no slurring noted. Pt denies blurred vision or double vision. Has had headache like this before, but  Stated it goes away with Tylenol- this has lasted 2 days. Asked pt to check BP and since BP was 176/106 advised have someone drive her to the ED ASAP. Pt stated she will have her son drive her to [facility].  Care advice given and pt verbalized understanding.  Reason for Disposition  [3] Systolic BP  >= 154 OR Diastolic >= 008 AND [6] cardiac or neurologic symptoms (e.g., chest pain, difficulty breathing, unsteady gait, blurred vision)    Moderate intermittent stabbing head ain for 2 days  Answer Assessment - Initial Assessment Questions 1. LOCATION: "Where does it hurt?"      Right side of head 2. ONSET: "When did the headache start?" (Minutes, hours or days)      Yesterday at 2 pm 3. PATTERN: "Does the pain come and go, or has it been constant since it started?"     Pain comes and goes  4. SEVERITY: "How bad is the pain?" and "What does it keep you from doing?"  (e.g., Scale 1-10; mild, moderate, or severe)   - MILD (1-3): doesn't interfere with normal activities    - MODERATE (4-7): interferes with normal activities or awakens from sleep    - SEVERE (8-10): excruciating pain, unable to do any normal activities        moderate 5. RECURRENT SYMPTOM: "Have you ever had headaches before?" If Yes, ask: "When was the last time?" and "What happened that time?"      Yes- pt stated she has had this type of headache before when her BP was elevated but it did not last as long as this current  head pain 6. CAUSE: "What do you think is causing the headache?"     Pt does not know 7. MIGRAINE: "Have you been diagnosed with migraine headaches?" If Yes, ask: "Is this headache similar?"      no 8. HEAD INJURY: "Has there been any recent injury to the head?"      no 9. OTHER SYMPTOMS: "Do you have any other symptoms?" (fever, stiff neck, eye pain, sore throat, cold symptoms)     Occasional eye pain 10. PREGNANCY: "Is there any chance you are pregnant?" "When was your last menstrual period?"       No irregular periods  Answer Assessment - Initial Assessment Questions 1. BLOOD PRESSURE: "What is the blood pressure?" "Did you take at least two measurements 5 minutes apart?"     176/106 2. ONSET: "When did you take your blood pressure?"    During triage call 3. HOW: "How did you obtain the blood pressure?" (e.g., visiting nurse, automatic home BP monitor)     Home blood pressure monitor 4. HISTORY: "Do you have a history of high blood pressure?"     yes 5. MEDICATIONS: "Are you taking any medications for blood pressure?" "Have you missed any doses recently?"     no 6. OTHER SYMPTOMS: "Do you have any symptoms?" (e.g., headache,  chest pain, blurred vision, difficulty breathing, weakness)     2 day h/o intermittent stabbing pain to the side of her head  Protocols used: BLOOD PRESSURE - HIGH-A-AH, HEADACHE-A-AH

## 2020-03-14 NOTE — ED Provider Notes (Signed)
[facility] Emergency Department Provider Note   ____________________________________________    I have reviewed the triage vital signs and the nursing notes.   HISTORY  Chief Complaint Headache     HPI Kristine Mcconnell is a 54 y.o. female who presents with complaints of a headache. Patient reports for the last 1 to 2 days she has had intermittent brief sharp stabbing pain in her right parietal area. No neuro deficits, no change in vision. She reports he has had this in the past typically respond to Tylenol however she has taken Tylenol over the last 1 day with little improvement. No fevers or chills. No neck pain. No temporal pain. No injury to the area. Review of record demonstrates the patient has had slightly elevated blood pressures for some time however has never been on blood pressure medication. When she does not have the brief stabbing pain which last less than 5 seconds she reports he feels well. She has not received Covid vaccines  Past Medical History:  Diagnosis Date  . Anemia     Patient Active Problem List   Diagnosis Date Noted  . Elevated TSH 08/20/2019  . Vitamin D deficiency 08/20/2019  . Sebaceous cyst 03/14/2019  . 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 Obesity (BMI 35.0-39.9 without comorbidity) 06/10/2016  . Light headedness 10/26/2015  . Right ankle pain 10/26/2015    Past Surgical History:  Procedure Laterality Date  . BREAST CYST ASPIRATION Left 1990   benign  . COLONOSCOPY WITH PROPOFOL N/A 08/03/2018   Procedure: COLONOSCOPY WITH PROPOFOL;  Surgeon: Lin Landsman, MD;  Location: [facility] ENDOSCOPY;  Service: Gastroenterology;  Laterality: N/A;  . DILATATION & CURETTAGE/HYSTEROSCOPY WITH MYOSURE N/A 06/09/2017   Procedure: DILATATION & CURETTAGE/HYSTEROSCOPY WITH MYOSURE RESECTION OF FIBROID;  Surgeon: Schermerhorn, Gwen Her, MD;  Location: [facility] ORS;  Service: Gynecology;  Laterality: N/A;  . DILATION AND CURETTAGE OF UTERUS      . FOOT SURGERY Right 2011  . HYSTEROSCOPY WITH NOVASURE N/A 06/09/2017   Procedure: HYSTEROSCOPY WITH NOVASURE;  Surgeon: Schermerhorn, Gwen Her, MD;  Location: [facility] ORS;  Service: Gynecology;  Laterality: N/A;  . lapband  more than 10 years ago  . MASS EXCISION Right 03/17/2019   Procedure: EXCISION OF RIGHT AXILLARY LEISION;  Surgeon: Vickie Epley, MD;  Location: [place];  Service: Vascular;  Laterality: Right;  . TONSILLECTOMY  1977    Prior to Admission medications   Medication Sig Start Date End Date Taking? Authorizing Provider  amLODipine-benazepril (LOTREL) 5-10 MG capsule Take 1 capsule by mouth daily. 03/14/20 05/13/20  Lavonia Drafts, MD  butalbital-acetaminophen-caffeine (FIORICET) [phone] MG tablet Take 1-2 tablets by mouth every 6 (six) hours as needed for headache. 03/14/20 03/14/21  Lavonia Drafts, MD  famotidine (PEPCID) 40 MG tablet Take 1 tablet (40 mg total) by mouth daily. 08/17/19   Hubbard Hartshorn, FNP  ferrous sulfate 325 (65 FE) MG tablet Take 325 mg by mouth daily with breakfast.    [provider]  simethicone (GAS-X) 80 MG chewable tablet Chew 1 tablet (80 mg total) by mouth 3 (three) times daily as needed for flatulence. 08/17/19   Hubbard Hartshorn, FNP  VITAMIN D PO Take by mouth.    [provider]     Allergies Other  Family History  Problem Relation Age of Onset  . Diabetes Mother   . Hypertension Mother   . Hypertension Father   . Breast cancer Neg Hx   . Colon  cancer Neg Hx     Social History Social History   Tobacco Use  . Smoking status: Never Smoker  . Smokeless tobacco: Never Used  Vaping Use  . Vaping Use: Never used  Substance Use Topics  . Alcohol use: Yes    Alcohol/week: 1.0 standard drink    Types: 1 Glasses of wine per week    Comment: OCC  . Drug use: No    Review of Systems  Constitutional: No fever/chills Eyes: As above ENT: No sore throat. Cardiovascular: Denies chest  pain. Respiratory: Denies shortness of breath. Gastrointestinal:  No nausea, no vomiting.   Genitourinary: Negative for dysuria. Musculoskeletal: Negative for back pain. Skin: Negative for rash. Neurological: As above   ____________________________________________   PHYSICAL EXAM:  VITAL SIGNS: ED Triage Vitals  Enc Vitals Group     BP 03/14/20 1106 (!) 197/95     Pulse Rate 03/14/20 1106 62     Resp 03/14/20 1106 18     Temp 03/14/20 1106 98.3 F (36.8 C)     Temp Source 03/14/20 1106 Oral     SpO2 03/14/20 1106 100 %     Weight 03/14/20 1106 102.1 kg (225 lb)     Height 03/14/20 1106 1.651 m (5\' 5" )     Head Circumference --      Peak Flow --      Pain Score 03/14/20 1112 6     Pain Loc --      Pain Edu? --      Excl. in [place]? --     Constitutional: Alert and oriented.  Pleasant and interactive Eyes: Conjunctivae are normal. PERRLA, EOMI Head: Atraumatic. No temporal tenderness to palpation Nose: No congestion/rhinnorhea. No sinus tenderness Mouth/Throat: Mucous membranes are moist.   Neck:  Painless ROM no vertebral tenderness palpation Cardiovascular: Normal rate, regular rhythm. Grossly normal heart sounds.  Good peripheral circulation. Respiratory: Normal respiratory effort.  No retractions. Gastrointestinal: Soft and nontender. No distention.  No CVA tenderness.  Musculoskeletal:   Warm and well perfused Neurologic:  Normal speech and language. No gross focal neurologic deficits are appreciated. Cranial nerves normal Skin:  Skin is warm, dry and intact. No rash noted. Psychiatric: Mood and affect are normal. Speech and behavior are normal.  ____________________________________________   LABS (all labs ordered are listed, but only abnormal results are displayed)  Labs Reviewed - No data to display ____________________________________________  EKG  None ____________________________________________  RADIOLOGY  CT  head ____________________________________________   PROCEDURES  Procedure(s) performed: No  Procedures   Critical Care performed: No ____________________________________________   INITIAL IMPRESSION / ASSESSMENT AND PLAN / ED COURSE  Pertinent labs & imaging results that were available during my care of the patient were reviewed by me and considered in my medical decision making (see chart for details).  Patient well-appearing and in no acute distress, exam is significant primarily for high blood pressure. In reviewing blood pressures from the last year she is typically fairly normotensive or slightly hypertensive. She has not been on any blood pressure medications. Headache symptoms are very mild suspect generalized headache versus tension/stress headache. Will treat with IM Toradol, recommended lab work however she notes that since she had normal kidney function 6 months ago and because she works for [employer] she would like to have outpatient labs done. She understands that we will be starting blood pressure medication today under the assumption that kidney function is normal.  After p.o. medications blood pressure improved significantly.  CT scan is  reassuring.  Exam remains reassuring.  Appropriate for discharge with antihypertensives, outpatient follow-up with PCP, outpatient lab work.  Return precautions discussed      ____________________________________________   FINAL CLINICAL IMPRESSION(S) / ED DIAGNOSES  Final diagnoses:  Acute nonintractable headache, unspecified headache type  Hypertension, unspecified type        Note:  This document was prepared using Dragon voice recognition software and may include unintentional dictation errors.   Lavonia Drafts, MD 03/14/20 1435

## 2020-03-15 NOTE — Telephone Encounter (Signed)
FYI

## 2020-03-28 NOTE — Progress Notes (Signed)
Patient ID: Kristine Mcconnell, female    DOB: 03-13-1966, 54 y.o.   MRN: [id number]  PCP: [facility]  Chief Complaint  Patient presents with  . Hypertension    had a headache on 6/22, went to ED, was told bp was high    Subjective:   Kristine Mcconnell is a 55 y.o. female, presents to clinic with CC of the following:  Chief Complaint  Patient presents with  . Hypertension    had a headache on 6/22, went to ED, was told bp was high    HPI:  Patient is a 54 year old female Last visit with Raelyn Ensign in January 2021 Follows up today after seen in the emergency department a week ago with headaches and high blood pressure concerns. Her initial blood pressure reading in the emergency room was 197/95 Headache symptoms were felt to be fairly mild in the emergency room, CT scan was unremarkable She was started on amlodipine-benazepril 5/10 on that visit  She did not have labs done as she noted she works for [employer], and last labs were okay including her kidney function   HTN -  Was mildly elevated at 142/86 on 09/07/2019, follow-up visit in January to recheck blood pressure was normal, before emergency room visit as noted above She has been checking BP at home, running a little high. Told her cuff runs higher than ours on checks in past, giving her less concerns for some of the elevated readings at home. Medication regimen-amlodipine-benazepril-5/10 daily (started in the emergency room) Taking medicine intermittently, not daily. Skips days. Took yesterday, not today.  BP Readings from Last 3 Encounters:  03/29/20 134/78  03/14/20 (!) 169/88  10/06/19 126/78   She did note today her goal of not wanting to be on a blood pressure medication. Denies chest pains, palpitations, shortness of breath, increased lower extremity swelling, increased headaches - not since left ED, vision changes  Obesity Wt Readings from Last 3 Encounters:  03/29/20 234 lb 1.6 oz (106.2 kg)   03/14/20 225 lb (102.1 kg)  09/07/19 230 lb 8 oz (104.6 kg)    Abnormal TSH: Had elevated TSH in November 2020.  Follow-up reading in January 2021 was normal  no history of medication or thyroid issues in the past.  Patient denies constipation/diarrhea, palpitations, unintentional weight changes,  Lab Results  Component Value Date   TSH 3.940 10/20/2019     Patient Active Problem List   Diagnosis Date Noted  . Elevated TSH 08/20/2019  . Vitamin D deficiency 08/20/2019  . Sebaceous cyst 03/14/2019  . 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 Obesity (BMI 35.0-39.9 without comorbidity) 06/10/2016  . Light headedness 10/26/2015  . Right ankle pain 10/26/2015      Current Outpatient Medications:  .  amLODipine-benazepril (LOTREL) 5-10 MG capsule, Take 1 capsule by mouth daily., Disp: 30 capsule, Rfl: 1 .  butalbital-acetaminophen-caffeine (FIORICET) 50-325-40 MG tablet, Take 1-2 tablets by mouth every 6 (six) hours as needed for headache., Disp: 20 tablet, Rfl: 0 .  famotidine (PEPCID) 40 MG tablet, Take 1 tablet (40 mg total) by mouth daily. (Patient not taking: Reported on 03/29/2020), Disp: 90 tablet, Rfl: 1   Allergies  Allergen Reactions  . Other     BAND-AIDS IRRITATE SKIN-PT PREFERS TAPE     Past Surgical History:  Procedure Laterality Date  . BREAST CYST ASPIRATION Left 1990   benign  . COLONOSCOPY WITH PROPOFOL N/A 08/03/2018   Procedure: COLONOSCOPY WITH PROPOFOL;  Surgeon:  Lin Landsman, MD;  Location: [facility] ENDOSCOPY;  Service: Gastroenterology;  Laterality: N/A;  . DILATATION & CURETTAGE/HYSTEROSCOPY WITH MYOSURE N/A 06/09/2017   Procedure: DILATATION & CURETTAGE/HYSTEROSCOPY WITH MYOSURE RESECTION OF FIBROID;  Surgeon: Schermerhorn, Gwen Her, MD;  Location: [facility] ORS;  Service: Gynecology;  Laterality: N/A;  . DILATION AND CURETTAGE OF UTERUS    . FOOT SURGERY Right 2011  . HYSTEROSCOPY WITH NOVASURE N/A 06/09/2017   Procedure: HYSTEROSCOPY WITH NOVASURE;  Surgeon:  Schermerhorn, Gwen Her, MD;  Location: [facility] ORS;  Service: Gynecology;  Laterality: N/A;  . lapband  more than 10 years ago  . MASS EXCISION Right 03/17/2019   Procedure: EXCISION OF RIGHT AXILLARY LEISION;  Surgeon: Vickie Epley, MD;  Location: [place];  Service: Vascular;  Laterality: Right;  . TONSILLECTOMY  1977     Family History  Problem Relation Age of Onset  . Diabetes Mother   . Hypertension Mother   . Hypertension Father   . Breast cancer Neg Hx   . Colon cancer Neg Hx      Social History   Tobacco Use  . Smoking status: Never Smoker  . Smokeless tobacco: Never Used  Substance Use Topics  . Alcohol use: Yes    Alcohol/week: 1.0 standard drink    Types: 1 Glasses of wine per week    Comment: OCC    With staff assistance, above reviewed with the patient today.  ROS: As per HPI, otherwise no specific complaints on a limited and focused system review   No results found for this or any previous visit (from the past 72 hour(s)).   PHQ2/9: Depression screen [facility] 2/9 03/29/2020 10/06/2019 09/07/2019 08/17/2019 12/10/2018  Decreased Interest 0 0 0 0 0  Down, Depressed, Hopeless 0 0 0 0 0  PHQ - 2 Score 0 0 0 0 0  Altered sleeping 0 0 0 0 0  Tired, decreased energy 0 0 0 1 0  Change in appetite 0 0 0 0 0  Feeling bad or failure about yourself  0 0 0 0 0  Trouble concentrating 0 0 0 0 0  Moving slowly or fidgety/restless 0 0 0 0 0  Suicidal thoughts 0 0 0 0 0  PHQ-9 Score 0 0 0 1 0  Difficult doing work/chores Not difficult at all Not difficult at all Not difficult at all Not difficult at all Not difficult at all   PHQ-2/9 Result is neg  Fall Risk: Fall Risk  03/29/2020 10/06/2019 09/07/2019 08/17/2019 06/18/2019  Falls in the past year? 0 0 0 0 0  Number falls in past yr: 0 0 0 0 -  Injury with Fall? 0 0 0 0 -  Follow up - Falls evaluation completed Falls evaluation completed Falls evaluation completed -      Objective:   Vitals:   03/29/20 0916   BP: 134/78  Pulse: 91  Resp: 16  Temp: 97.9 F (36.6 C)  TempSrc: Temporal  SpO2: 100%  Weight: 234 lb 1.6 oz (106.2 kg)  Height: 5\' 5"  (1.651 m)    Body mass index is 38.96 kg/m. Recheck blood pressure by myself was 128/82 with large adult cuff on the left Physical Exam   NAD, masked, pleasant HEENT - [place]/AT, sclera anicteric, PERRL, EOMI, conj - non-inj'ed,  pharynx clear Neck - supple, no adenopathy, no TM, carotids 2+ and = without bruits bilat Car - RRR without m/g/r Pulm- RR and effort normal at rest, CTA without wheeze or rales Abd -  soft, NT diffusely, obese, ND,  Back - no CVA tenderness Ext - no LE edema,  Neuro/psychiatric - affect was not flat, appropriate with conversation  Alert   Grossly non-focal   Speech normal   Results for orders placed or performed in visit on 83/38/25  Basic metabolic panel  Result Value Ref Range   Glucose 94 65 - 99 mg/dL   BUN 10 6 - 24 mg/dL   Creatinine, Ser 0.73 0.57 - 1.00 mg/dL   GFR calc non Af Amer 96 >59 mL/min/1.73   GFR calc Af Amer 110 >59 mL/min/1.73   BUN/Creatinine Ratio 14 9 - 23   Sodium 141 134 - 144 mmol/L   Potassium 3.9 3.5 - 5.2 mmol/L   Chloride 103 96 - 106 mmol/L   CO2 28 20 - 29 mmol/L   Calcium 9.8 8.7 - 10.2 mg/dL  Thyroid Panel With TSH  Result Value Ref Range   TSH 3.940 0.450 - 4.500 uIU/mL   T4, Total 7.2 4.5 - 12.0 ug/dL   T3 Uptake Ratio 19 (L) 24 - 39 %   Free Thyroxine Index 1.4 1.2 - 4.9   Last labs reviewed     Assessment & Plan:   1. Essential hypertension Discussed with patient how I do think she should be on a medication for her blood pressure control, and concerns raised with her taking her blood pressure medicine intermittently presently.  Also feels she needs to bring her cuff in again to compare with checks here, as the higher readings at home may be real.  She does have some ability with her blood pressure, and noted the challenge of trying to get her average blood pressure  well controlled with intermittent periodic checks. She is currently on a medicine that has 2 medications, and felt changing that to one medication to start would be appropriate, with amlodipine-5 mg daily continued.  I did note this is a smaller dose, and may need to increase that dose over time, and await her response.  Did emphasize the importance of taking this daily. We will recheck a BMP as well. - amLODipine (NORVASC) 5 MG tablet; Take 1 tablet (5 mg total) by mouth daily.  Dispense: 90 tablet; Refill: 3 - Basic metabolic panel  2. Abnormal TSH Her most recent TSH was normal, with the one prior elevated, and after discussion, felt was reasonable to recheck a TSH again presently. - TSH  3. Nonintractable episodic headache, unspecified headache type She notes she has not had increasing headaches since the emergency room visit, with the importance of keeping her blood pressure control noted presently.  4. Class 2 severe obesity due to excess calories with serious comorbidity and body mass index (BMI) of 38.0 to 38.9 in adult [facility]) I also noted that blood pressure can correlate with weight, and the importance of healthy weight maintenance and helping keep blood pressure controlled.   Await labs ordered, and she agreed to continue in amlodipine product-once daily for better blood pressure control, and assess her response. Discussed follow-up, and she wanted to follow-up in about 4 weeks time to reassess, and I did recommend she bring her blood pressure cuff to that visit and also writing down blood pressure she is taking at home to share as well. And follow-up sooner as needed       Towanda Malkin, MD 03/29/20 9:41 AM

## 2020-03-29 ENCOUNTER — Other Ambulatory Visit: Payer: Self-pay

## 2020-03-29 ENCOUNTER — Ambulatory Visit: Payer: Managed Care, Other (non HMO) | Admitting: Internal Medicine

## 2020-03-29 ENCOUNTER — Encounter: Payer: Self-pay | Admitting: Internal Medicine

## 2020-03-29 VITALS — BP 134/78 | HR 91 | Temp 97.9°F | Resp 16 | Ht 65.0 in | Wt 234.1 lb

## 2020-03-29 DIAGNOSIS — I10 Essential (primary) hypertension: Secondary | ICD-10-CM | POA: Insufficient documentation

## 2020-03-29 DIAGNOSIS — R519 Headache, unspecified: Secondary | ICD-10-CM

## 2020-03-29 DIAGNOSIS — Z09 Encounter for follow-up examination after completed treatment for conditions other than malignant neoplasm: Secondary | ICD-10-CM

## 2020-03-29 DIAGNOSIS — R7989 Other specified abnormal findings of blood chemistry: Secondary | ICD-10-CM | POA: Diagnosis not present

## 2020-03-29 DIAGNOSIS — Z6838 Body mass index (BMI) 38.0-38.9, adult: Secondary | ICD-10-CM

## 2020-03-29 MED ORDER — AMLODIPINE BESYLATE 5 MG PO TABS: 5.0000 mg | ORAL_TABLET | Freq: Every day | ORAL | 3 refills | Status: DC

## 2020-03-29 NOTE — Patient Instructions (Signed)

## 2020-03-30 LAB — BASIC METABOLIC PANEL
BUN/Creatinine Ratio: 17 (ref 9–23)
BUN: 13 mg/dL (ref 6–24)
CO2: 23 mmol/L (ref 20–29)
Calcium: 9.4 mg/dL (ref 8.7–10.2)
Chloride: 108 mmol/L — ABNORMAL HIGH (ref 96–106)
Creatinine, Ser: 0.76 mg/dL (ref 0.57–1.00)
GFR calc Af Amer: 105 mL/min/{1.73_m2} (ref >59)
GFR calc non Af Amer: 91 mL/min/{1.73_m2} (ref >59)
Glucose: 102 mg/dL — ABNORMAL HIGH (ref 65–99)
Potassium: 4.6 mmol/L (ref 3.5–5.2)
Sodium: 143 mmol/L (ref 134–144)

## 2020-03-30 LAB — TSH: TSH: 4.69 u[IU]/mL — ABNORMAL HIGH (ref 0.450–4.500)

## 2020-04-04 ENCOUNTER — Telehealth: Payer: Self-pay

## 2020-04-04 NOTE — Telephone Encounter (Signed)
Copied from [place] [phone]. Topic: General - Other >> Apr 03, 2020  3:09 PM Keene Breath wrote: Reason for CRM: Patient stated that she is returning a call to [facility].  She was not sure why Charley Miske was calling and she did not leave a message.  Please call patient back at [phone]

## 2020-04-06 ENCOUNTER — Other Ambulatory Visit: Payer: Self-pay | Admitting: Obstetrics and Gynecology

## 2020-04-06 NOTE — Telephone Encounter (Signed)
Pt states that she still has not gotten a reason for return call on 12th. She thinks it is her lab results. Not in file, pt said she went to [facility] for labs recently, does not know when and wants these results. Pt told that not seeing result in last week or so in system but will notify office to call her back if them have any information to share or has been trying to contact her. Pt is not readily accepting explanations.

## 2020-04-17 ENCOUNTER — Other Ambulatory Visit: Payer: Self-pay | Admitting: Obstetrics and Gynecology

## 2020-04-17 DIAGNOSIS — Z1231 Encounter for screening mammogram for malignant neoplasm of breast: Secondary | ICD-10-CM

## 2020-04-26 NOTE — Progress Notes (Signed)
Patient ID: Kristine Mcconnell, female    DOB: 10-08-1965, 54 y.o.   MRN: [id number]  PCP: Towanda Malkin, MD  Chief Complaint  Patient presents with  . Follow-up  . Hypertension    Subjective:   Kristine Mcconnell is a 54 y.o. female, presents to clinic with CC of the following:  Chief Complaint  Patient presents with  . Follow-up  . Hypertension    HPI:  Patient is a 54 year old female Last visit with Raquel Sarna was in January 2021 Last visit with me was 03/29/2020 on follow-up from an emergency room visit for high blood pressure. Communication with her after the labs were reviewed from the last visit was as follows:  Her basic metabolic panel showed that her kidney function remains good, with her blood sugar just out of the top normal range at 102. Her TSH was slightly higher than previous, and just top of the normal range at 4.69. We will continue to monitor presently. Please encourage her to take the amlodipine medicine for her blood pressure once daily and keep her planned follow-up. Follows up today.   To review, on that recent emergency room visit, her initial blood pressure reading in the emergency room was 197/95 Headache symptoms were felt to be fairly mild in the emergency room, CT scan was unremarkable She was started on amlodipine-benazepril 5/10 on that visit  She did not have labs done as she noted she works for [employer], and last labs were okay including her kidney function   HTN -  Medication regimen-amlodipine 5 mg daily (noted her goal of not wanting to be on blood pressure medications previous visit, and she agreed to take the amlodipine 5 mg daily at that time) Taking medication regularly (previous to the emergency room visit, was taking medicine intermittently, not daily) She did bring her cuff to compare with readings here today as was requested previously (was previously told her cuff runs a little higher than ours on checks in past) - is a wrist  cuff She has been checking BP at home, running a little high at times.     BP Readings from Last 3 Encounters:  04/27/20 118/76  03/29/20 134/78  03/14/20 (!) 169/88    Denies chest pains, palpitations, shortness of breath, increased lower extremity swelling, increased headaches - not since left ED, vision changes  Obesity Wt Readings from Last 3 Encounters:  04/27/20 235 lb 8 oz (106.8 kg)  03/29/20 234 lb 1.6 oz (106.2 kg)  03/14/20 225 lb (102.1 kg)   Weight has remained fairly stable this past month, after increasing some in the month prior. Noted previously that blood pressure can often correlate with weight, and his weight increases, blood pressure often increases  Abnormal TSH: Had elevated TSH in November 2020.  Follow-up reading in January 2021 was normal  Recheck in July was slightly elevated no history of medication or thyroid issues in the past. Patient deniesconstipation/diarrhea, palpitations, unintentional weight changes,  Lab Results  Component Value Date   TSH 4.690 (H) 03/29/2020     Patient Active Problem List   Diagnosis Date Noted  . Essential hypertension 03/29/2020  . Class 2 severe obesity due to excess calories with serious comorbidity and body mass index (BMI) of 38.0 to 38.9 in adult ([place]) 03/29/2020  . Elevated TSH 08/20/2019  . Vitamin D deficiency 08/20/2019  . Sebaceous cyst 03/14/2019  . 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 Obesity (BMI 35.0-39.9 without comorbidity) 06/10/2016  .  Light headedness 10/26/2015  . Right ankle pain 10/26/2015      Current Outpatient Medications:  .  amLODipine (NORVASC) 5 MG tablet, Take 1 tablet (5 mg total) by mouth daily., Disp: 90 tablet, Rfl: 3 .  amLODipine-benazepril (LOTREL) 5-10 MG capsule, Take 1 capsule by mouth daily. (Patient not taking: Reported on 04/27/2020), Disp: 30 capsule, Rfl: 1 .  butalbital-acetaminophen-caffeine (FIORICET) 50-325-40 MG tablet, Take 1-2 tablets by mouth every 6 (six) hours  as needed for headache. (Patient not taking: Reported on 04/27/2020), Disp: 20 tablet, Rfl: 0 .  famotidine (PEPCID) 40 MG tablet, Take 1 tablet (40 mg total) by mouth daily. (Patient not taking: Reported on 03/29/2020), Disp: 90 tablet, Rfl: 1   Allergies  Allergen Reactions  . Other     BAND-AIDS IRRITATE SKIN-PT PREFERS TAPE     Past Surgical History:  Procedure Laterality Date  . BREAST CYST ASPIRATION Left 1990   benign  . COLONOSCOPY WITH PROPOFOL N/A 08/03/2018   Procedure: COLONOSCOPY WITH PROPOFOL;  Surgeon: Lin Landsman, MD;  Location: [facility] ENDOSCOPY;  Service: Gastroenterology;  Laterality: N/A;  . DILATATION & CURETTAGE/HYSTEROSCOPY WITH MYOSURE N/A 06/09/2017   Procedure: DILATATION & CURETTAGE/HYSTEROSCOPY WITH MYOSURE RESECTION OF FIBROID;  Surgeon: Schermerhorn, Gwen Her, MD;  Location: [facility] ORS;  Service: Gynecology;  Laterality: N/A;  . DILATION AND CURETTAGE OF UTERUS    . FOOT SURGERY Right 2011  . HYSTEROSCOPY WITH NOVASURE N/A 06/09/2017   Procedure: HYSTEROSCOPY WITH NOVASURE;  Surgeon: Schermerhorn, Gwen Her, MD;  Location: [facility] ORS;  Service: Gynecology;  Laterality: N/A;  . lapband  more than 10 years ago  . MASS EXCISION Right 03/17/2019   Procedure: EXCISION OF RIGHT AXILLARY LEISION;  Surgeon: Vickie Epley, MD;  Location: [place];  Service: Vascular;  Laterality: Right;  . TONSILLECTOMY  1977     Family History  Problem Relation Age of Onset  . Diabetes Mother   . Hypertension Mother   . Hypertension Father   . Breast cancer Neg Hx   . Colon cancer Neg Hx      Social History   Tobacco Use  . Smoking status: Never Smoker  . Smokeless tobacco: Never Used  Substance Use Topics  . Alcohol use: Yes    Alcohol/week: 1.0 standard drink    Types: 1 Glasses of wine per week    Comment: OCC    With staff assistance, above reviewed with the patient today.  ROS: As per HPI, otherwise no specific complaints on a limited and  focused system review   No results found for this or any previous visit (from the past 72 hour(s)).   PHQ2/9: Depression screen [facility] 2/9 04/27/2020 03/29/2020 10/06/2019 09/07/2019 08/17/2019  Decreased Interest 0 0 0 0 0  Down, Depressed, Hopeless 0 0 0 0 0  PHQ - 2 Score 0 0 0 0 0  Altered sleeping 0 0 0 0 0  Tired, decreased energy 0 0 0 0 1  Change in appetite 0 0 0 0 0  Feeling bad or failure about yourself  0 0 0 0 0  Trouble concentrating 0 0 0 0 0  Moving slowly or fidgety/restless 0 0 0 0 0  Suicidal thoughts 0 0 0 0 0  PHQ-9 Score 0 0 0 0 1  Difficult doing work/chores - Not difficult at all Not difficult at all Not difficult at all Not difficult at all  Some recent data might be hidden   PHQ-2/9 Result is neg  Fall Risk: Fall Risk  04/27/2020 04/27/2020 03/29/2020 10/06/2019 09/07/2019  Falls in the past year? 0 0 0 0 0  Number falls in past yr: 0 0 0 0 0  Injury with Fall? 0 0 0 0 0  Follow up - - - Falls evaluation completed Falls evaluation completed      Objective:   Vitals:   04/27/20 1512  BP: 118/76  Pulse: 89  Resp: 16  Temp: 98.6 F (37 C)  TempSrc: Oral  SpO2: 100%  Weight: 235 lb 8 oz (106.8 kg)  Height: 5\' 5"  (1.651 m)    Body mass index is 39.19 kg/m. Recheck blood pressure by myself was 122/74 on the left with the adult cuff Right after, the blood pressure check with her machine was high, 150s over 100s  Physical Exam   NAD, masked, pleasant HEENT - [place]/AT, sclera anicteric, PERRL, EOMI, conj - non-inj'ed,  pharynx clear Neck - supple, no adenopathy, no TM, carotids 2+ and = without bruits bilat Car - RRR without m/g/r Pulm- RR and effort normal at rest, CTA without wheeze or rales Abd - soft, NT diffusely, obese,  Back - no CVA tenderness Ext - no LE edema,  Neuro/psychiatric - affect was not flat, appropriate with conversation             Alert             Speech normal    Results for orders placed or performed in visit on 03/29/20  TSH    Result Value Ref Range   TSH 4.690 (H) 0.450 - 4.500 uIU/mL  Basic metabolic panel  Result Value Ref Range   Glucose 102 (H) 65 - 99 mg/dL   BUN 13 6 - 24 mg/dL   Creatinine, Ser 0.76 0.57 - 1.00 mg/dL   GFR calc non Af Amer 91 >59 mL/min/1.73   GFR calc Af Amer 105 >59 mL/min/1.73   BUN/Creatinine Ratio 17 9 - 23   Sodium 143 134 - 144 mmol/L   Potassium 4.6 3.5 - 5.2 mmol/L   Chloride 108 (H) 96 - 106 mmol/L   CO2 23 20 - 29 mmol/L   Calcium 9.4 8.7 - 10.2 mg/dL       Assessment & Plan:   1. Essential hypertension Blood pressure seems well controlled on the low-dose amlodipine on checks here in the office. Concerned that her wrist blood pressure machine at home that she brought today is not very accurate. Noted that wrist cuff machines can be much less accurate than the ones on the upper arm. Felt best to continue the low-dose amlodipine presently If she is able to get a couple checks on the outside with an upper arm cuff, like at a [facility], would recommend to do to help ensure blood pressures remain controlled.  2. Abnormal TSH Noted last recheck of her TSH was just slightly above the normal range, Felt best to continue to monitor presently, and will recheck a TSH again in approximately 3 months. Noted a higher TSH correlates with low thyroid, following up as above is felt indicated.  3. Class 2 severe obesity due to excess calories with serious comorbidity and body mass index (BMI) of 38.0 to 38.9 in adult ([facility]) Weight this past month has remained fairly stable, and the importance of healthy weight maintenance again discussed today.  Important to help keep blood pressure controlled.   We will tentatively schedule follow-up for 3 months, and recheck the TSH at that time as well  as rechecking her blood pressures again to help ensure are staying controlled. Follow-up sooner as needed.       Towanda Malkin, MD 04/27/20 3:16 PM

## 2020-04-27 ENCOUNTER — Ambulatory Visit: Payer: Managed Care, Other (non HMO) | Admitting: Internal Medicine

## 2020-04-27 ENCOUNTER — Encounter: Payer: Self-pay | Admitting: Internal Medicine

## 2020-04-27 ENCOUNTER — Other Ambulatory Visit: Payer: Self-pay

## 2020-04-27 VITALS — BP 118/76 | HR 89 | Temp 98.6°F | Resp 16 | Ht 65.0 in | Wt 235.5 lb

## 2020-04-27 DIAGNOSIS — I10 Essential (primary) hypertension: Secondary | ICD-10-CM | POA: Diagnosis not present

## 2020-04-27 DIAGNOSIS — R7989 Other specified abnormal findings of blood chemistry: Secondary | ICD-10-CM

## 2020-04-27 DIAGNOSIS — Z6838 Body mass index (BMI) 38.0-38.9, adult: Secondary | ICD-10-CM | POA: Diagnosis not present

## 2020-05-15 ENCOUNTER — Other Ambulatory Visit: Payer: Self-pay

## 2020-05-15 ENCOUNTER — Ambulatory Visit
Admission: RE | Admit: 2020-05-15 | Discharge: 2020-05-15 | Disposition: A | Discharge status: Home / Self Care | Payer: Managed Care, Other (non HMO) | Source: Ambulatory Visit | Attending: Obstetrics and Gynecology | Admitting: Obstetrics and Gynecology

## 2020-05-15 DIAGNOSIS — Z1231 Encounter for screening mammogram for malignant neoplasm of breast: Secondary | ICD-10-CM | POA: Insufficient documentation

## 2020-07-28 NOTE — Progress Notes (Signed)
Patient ID: Kristine Mcconnell, female    DOB: 1966/04/23, 54 y.o.   MRN: [id number]  PCP: Towanda Malkin, MD  Chief Complaint  Patient presents with  . Follow-up    Subjective:   Kristine Mcconnell is a 54 y.o. female, presents to clinic with CC of the following:  Chief Complaint  Patient presents with  . Follow-up    HPI:  Patient is a 54 year old female Last visit with me was 04/27/2020 Follows up today  All in all, she notes she is feeling fine.   HTN - Medication regimen-amlodipine 5 mg daily (noted her goal of not wanting to be on blood pressure medications previous visit, and she agreed to take the amlodipine 5 mg daily at that time).  Not taking the medicine in last 3 weeks, notes she ran out and needs a refill through [facility] presently, to help save money on continued refills in the future.  Noted that it is a mail order pharmacy and will take time for her to get the medicine, and [facility] to get back on her blood pressure medicines presently, as await the mail order pharmacy to send her medication. Not been checking BP's at home recent past    BP Readings from Last 3 Encounters:  07/31/20 (!) 160/94  04/27/20 118/76  03/29/20 134/78   Denies chest pains, palpitations, shortness of breath, increased lower extremity swelling, increased headaches, vision changes  Obesity Wt Readings from Last 3 Encounters:  07/31/20 233 lb 8 oz (105.9 kg)  04/27/20 235 lb 8 oz (106.8 kg)  03/29/20 234 lb 1.6 oz (106.2 kg)   Weight has remained fairly stable  Noted previously that blood pressure can often correlate with weight, and as weight increases, blood pressure often increases  Abnormal TSH: Had elevated TSH in November 2020.Follow-up reading in January 2021 was normal Recheck in July was slightly elevated no history of medication or thyroid issues in the past. Patient deniesconstipation/diarrhea, palpitations, temp  insensitivity, unintentional weight changes, Lab Results  Component Value Date   TSH 4.690 (H) 03/29/2020       Patient Active Problem List   Diagnosis Date Noted  . Essential hypertension 03/29/2020  . Class 2 severe obesity due to excess calories with serious comorbidity and body mass index (BMI) of 38.0 to 38.9 in adult ([place]) 03/29/2020  . Elevated TSH 08/20/2019  . Vitamin D deficiency 08/20/2019  . Sebaceous cyst 03/14/2019  . 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 Obesity (BMI 35.0-39.9 without comorbidity) 06/10/2016  . Light headedness 10/26/2015  . Right ankle pain 10/26/2015      Current Outpatient Medications:  .  amLODipine (NORVASC) 5 MG tablet, Take 1 tablet (5 mg total) by mouth daily. (Patient not taking: Reported on 07/31/2020), Disp: 90 tablet, Rfl: 3   Allergies  Allergen Reactions  . Other     BAND-AIDS IRRITATE SKIN-PT PREFERS TAPE     Past Surgical History:  Procedure Laterality Date  . BREAST CYST ASPIRATION Left 1990   benign  . COLONOSCOPY WITH PROPOFOL N/A 08/03/2018   Procedure: COLONOSCOPY WITH PROPOFOL;  Surgeon: Lin Landsman, MD;  Location: [facility] ENDOSCOPY;  Service: Gastroenterology;  Laterality: N/A;  . DILATATION & CURETTAGE/HYSTEROSCOPY WITH MYOSURE N/A 06/09/2017   Procedure: DILATATION & CURETTAGE/HYSTEROSCOPY WITH MYOSURE RESECTION OF FIBROID;  Surgeon: Schermerhorn, Gwen Her, MD;  Location: [facility] ORS;  Service: Gynecology;  Laterality: N/A;  . DILATION AND CURETTAGE OF UTERUS    .  FOOT SURGERY Right 2011  . HYSTEROSCOPY WITH NOVASURE N/A 06/09/2017   Procedure: HYSTEROSCOPY WITH NOVASURE;  Surgeon: Schermerhorn, Gwen Her, MD;  Location: [facility] ORS;  Service: Gynecology;  Laterality: N/A;  . lapband  more than 10 years ago  . MASS EXCISION Right 03/17/2019   Procedure: EXCISION OF RIGHT AXILLARY LEISION;  Surgeon: Vickie Epley, MD;  Location: [place];  Service: Vascular;  Laterality: Right;  . TONSILLECTOMY  1977      Family History  Problem Relation Age of Onset  . Diabetes Mother   . Hypertension Mother   . Hypertension Father   . Breast cancer Neg Hx   . Colon cancer Neg Hx      Social History   Tobacco Use  . Smoking status: Never Smoker  . Smokeless tobacco: Never Used  Substance Use Topics  . Alcohol use: Yes    Alcohol/week: 1.0 standard drink    Types: 1 Glasses of wine per week    Comment: OCC    With staff assistance, above reviewed with the patient today.  ROS: As per HPI, otherwise no specific complaints on a limited and focused system review   No results found for this or any previous visit (from the past 72 hour(s)).   PHQ2/9: Depression screen [facility] 2/9 07/31/2020 04/27/2020 03/29/2020 10/06/2019 09/07/2019  Decreased Interest 0 0 0 0 0  Down, Depressed, Hopeless 0 0 0 0 0  PHQ - 2 Score 0 0 0 0 0  Altered sleeping - 0 0 0 0  Tired, decreased energy - 0 0 0 0  Change in appetite - 0 0 0 0  Feeling bad or failure about yourself  - 0 0 0 0  Trouble concentrating - 0 0 0 0  Moving slowly or fidgety/restless - 0 0 0 0  Suicidal thoughts - 0 0 0 0  PHQ-9 Score - 0 0 0 0  Difficult doing work/chores - - Not difficult at all Not difficult at all Not difficult at all  Some recent data might be hidden   PHQ-2/9 Result is neg Fall Risk: Fall Risk  07/31/2020 04/27/2020 04/27/2020 03/29/2020 10/06/2019  Falls in the past year? 0 0 0 0 0  Number falls in past yr: 0 0 0 0 0  Injury with Fall? 0 0 0 0 0  Follow up - - - - Falls evaluation completed      Objective:   Vitals:   07/31/20 1509  BP: (!) 160/94  Pulse: 64  Resp: 16  Temp: (!) 97.4 F (36.3 C)  TempSrc: Oral  SpO2: 91%  Weight: 233 lb 8 oz (105.9 kg)  Height: 5\' 5"  (1.651 m)    Body mass index is 38.86 kg/m. Recheck blood pressure by myself was 145/85 with an large adult cuff on the left Physical Exam  NAD, masked, pleasant HEENT - [place]/AT, sclera anicteric, PERRL, EOMI, conj - non-inj'ed, pharynx  clear Neck - supple, no adenopathy, no TM, carotids 2+ and = without bruits bilat Car - RRR without m/g/r Pulm- RR and effort normal at rest, CTA without wheeze or rales Abd - soft, NTdiffusely, obese,  Back - no CVA tenderness Ext - no LE edema,  Neuro/psychiatric - affect was not flat, appropriate with conversation Alert with normal speech  Grossly nonfocal, with good strength in testing grip and with dorsi and plantarflexion of the feet, and sensation intact to light touch in the distal extremities   Results for orders placed or performed  in visit on 03/29/20  TSH  Result Value Ref Range   TSH 4.690 (H) 0.450 - 4.500 uIU/mL  Basic metabolic panel  Result Value Ref Range   Glucose 102 (H) 65 - 99 mg/dL   BUN 13 6 - 24 mg/dL   Creatinine, Ser 0.76 0.57 - 1.00 mg/dL   GFR calc non Af Amer 91 >59 mL/min/1.73   GFR calc Af Amer 105 >59 mL/min/1.73   BUN/Creatinine Ratio 17 9 - 23   Sodium 143 134 - 144 mmol/L   Potassium 4.6 3.5 - 5.2 mmol/L   Chloride 108 (H) 96 - 106 mmol/L   CO2 23 20 - 29 mmol/L   Calcium 9.4 8.7 - 10.2 mg/dL   Last labs reviewed    Assessment & Plan:    1. Essential hypertension Blood pressure today was borderline high on recheck, was slightly higher on initial check.  Not taking the medicine in last 3 weeks, notes she ran out and needs a refill through [facility] presently, to help save money on continued refills in the future.  Noted that it is a mail order pharmacy and will take time for her to get the medicine, and [facility] to get back on her blood pressure medicines presently, as await the mail order pharmacy to send her medication. Emphasized the importance of continuing on the medication in the future.  2. Abnormal TSH Last rechecked just slightly above the normal range. Again noted that a higher TSH correlates with low thyroid. No recent concerning symptoms. We will recheck a TSH, and she will get that  through [facility].  Did note if the TSH is increasing, may consider a thyroid supplement over time. - TSH  3. Class 2 severe obesity due to excess calories with serious comorbidity and body mass index (BMI) of 38.0 to 38.9 in adult [facility]) She noted on the scale at home she has lost 5 to 6 pounds, and I did note she has lost some weight on our scale as well since last visit. Emphasized the importance of healthy weight maintenance over time and continuing attempts to lose weight encouraged. Dietary modifications in addition to trying to get more physically active would be helpful, and she notes she tries to get more activity at times during the day in the recent past  We will tentatively schedule follow-up for 6 months, I did note to her today she will be following up with a different provider as I am leaving the practice before that follow-up today, and should follow-up sooner as needed. Await that follow-up TSH result presently.       Towanda Malkin, MD 07/31/20 3:10 PM

## 2020-07-31 ENCOUNTER — Other Ambulatory Visit: Payer: Self-pay

## 2020-07-31 ENCOUNTER — Encounter: Payer: Self-pay | Admitting: Internal Medicine

## 2020-07-31 ENCOUNTER — Ambulatory Visit: Payer: Managed Care, Other (non HMO) | Admitting: Internal Medicine

## 2020-07-31 VITALS — BP 160/94 | HR 64 | Temp 97.4°F | Resp 16 | Ht 65.0 in | Wt 233.5 lb

## 2020-07-31 DIAGNOSIS — Z6838 Body mass index (BMI) 38.0-38.9, adult: Secondary | ICD-10-CM

## 2020-07-31 DIAGNOSIS — I10 Essential (primary) hypertension: Secondary | ICD-10-CM

## 2020-07-31 DIAGNOSIS — R7989 Other specified abnormal findings of blood chemistry: Secondary | ICD-10-CM

## 2020-07-31 MED ORDER — AMLODIPINE BESYLATE 5 MG PO TABS: 5.0000 mg | ORAL_TABLET | Freq: Every day | ORAL | 1 refills | Status: DC

## 2020-07-31 MED ORDER — AMLODIPINE BESYLATE 5 MG PO TABS: 5.0000 mg | ORAL_TABLET | Freq: Every day | ORAL | 3 refills | Status: DC

## 2020-08-04 LAB — TSH: TSH: 3.94 u[IU]/mL (ref 0.450–4.500)

## 2021-02-12 ENCOUNTER — Emergency Department
Admission: EM | Admit: 2021-02-12 | Discharge: 2021-02-12 | Disposition: A | Discharge status: Home / Self Care | Payer: Managed Care, Other (non HMO) | Attending: Emergency Medicine | Admitting: Emergency Medicine

## 2021-02-12 ENCOUNTER — Emergency Department: Payer: Managed Care, Other (non HMO)

## 2021-02-12 ENCOUNTER — Other Ambulatory Visit: Payer: Self-pay

## 2021-02-12 DIAGNOSIS — N200 Calculus of kidney: Secondary | ICD-10-CM | POA: Diagnosis not present

## 2021-02-12 DIAGNOSIS — Z79899 Other long term (current) drug therapy: Secondary | ICD-10-CM | POA: Insufficient documentation

## 2021-02-12 DIAGNOSIS — N23 Unspecified renal colic: Secondary | ICD-10-CM

## 2021-02-12 DIAGNOSIS — I10 Essential (primary) hypertension: Secondary | ICD-10-CM | POA: Diagnosis not present

## 2021-02-12 DIAGNOSIS — R109 Unspecified abdominal pain: Secondary | ICD-10-CM | POA: Diagnosis present

## 2021-02-12 LAB — CBC
HCT: 35.3 % — ABNORMAL LOW (ref 36.0–46.0)
Hemoglobin: 11.3 g/dL — ABNORMAL LOW (ref 12.0–15.0)
MCH: 25.3 pg — ABNORMAL LOW (ref 26.0–34.0)
MCHC: 32 g/dL (ref 30.0–36.0)
MCV: 79 fL — ABNORMAL LOW (ref 80.0–100.0)
Platelets: 334 10*3/uL (ref 150–400)
RBC: 4.47 MIL/uL (ref 3.87–5.11)
RDW: 14 % (ref 11.5–15.5)
WBC: 7.4 10*3/uL (ref 4.0–10.5)
nRBC: 0 % (ref 0.0–0.2)

## 2021-02-12 LAB — BASIC METABOLIC PANEL
Anion gap: 10 (ref 5–15)
BUN: 12 mg/dL (ref 6–20)
CO2: 24 mmol/L (ref 22–32)
Calcium: 8.8 mg/dL — ABNORMAL LOW (ref 8.9–10.3)
Chloride: 107 mmol/L (ref 98–111)
Creatinine, Ser: 0.88 mg/dL (ref 0.44–1.00)
GFR, Estimated: >60 mL/min (ref >60)
Glucose, Bld: 205 mg/dL — ABNORMAL HIGH (ref 70–99)
Potassium: 2.8 mmol/L — ABNORMAL LOW (ref 3.5–5.1)
Sodium: 141 mmol/L (ref 135–145)

## 2021-02-12 LAB — URINALYSIS, COMPLETE (UACMP) WITH MICROSCOPIC
Bilirubin Urine: NEGATIVE
Glucose, UA: NEGATIVE mg/dL
Ketones, ur: NEGATIVE mg/dL
Leukocytes,Ua: NEGATIVE
Nitrite: NEGATIVE
Protein, ur: NEGATIVE mg/dL
Specific Gravity, Urine: 1.015 (ref 1.005–1.030)
pH: 6 (ref 5.0–8.0)

## 2021-02-12 MED ORDER — IBUPROFEN 600 MG PO TABS: 600.0000 mg | ORAL_TABLET | Freq: Four times a day (QID) | ORAL | 0 refills | Status: DC | PRN

## 2021-02-12 MED ORDER — KETOROLAC TROMETHAMINE 30 MG/ML IJ SOLN
15.0000 mg | Freq: Once | INTRAMUSCULAR | Status: AC
  Administered 2021-02-12: 15 mg via INTRAVENOUS
  Filled 2021-02-12: qty 1

## 2021-02-12 MED ORDER — TAMSULOSIN HCL 0.4 MG PO CAPS
0.4000 mg | ORAL_CAPSULE | Freq: Once | ORAL | Status: AC
  Administered 2021-02-12: 0.4 mg via ORAL
  Filled 2021-02-12: qty 1

## 2021-02-12 MED ORDER — ONDANSETRON HCL 4 MG/2ML IJ SOLN
4.0000 mg | Freq: Once | INTRAMUSCULAR | Status: AC
  Administered 2021-02-12: 4 mg via INTRAVENOUS
  Filled 2021-02-12: qty 2

## 2021-02-12 MED ORDER — POTASSIUM CHLORIDE CRYS ER 20 MEQ PO TBCR
40.0000 meq | EXTENDED_RELEASE_TABLET | Freq: Once | ORAL | Status: AC
  Administered 2021-02-12: 40 meq via ORAL
  Filled 2021-02-12: qty 2

## 2021-02-12 MED ORDER — FENTANYL CITRATE (PF) 100 MCG/2ML IJ SOLN
50.0000 ug | Freq: Once | INTRAMUSCULAR | Status: AC
  Administered 2021-02-12: 50 ug via INTRAVENOUS
  Filled 2021-02-12: qty 2

## 2021-02-12 MED ORDER — ONDANSETRON 4 MG PO TBDP: 4.0000 mg | ORAL_TABLET | Freq: Three times a day (TID) | ORAL | 0 refills | Status: DC | PRN

## 2021-02-12 MED ORDER — OXYCODONE-ACETAMINOPHEN 5-325 MG PO TABS: 1.0000 | ORAL_TABLET | ORAL | 0 refills | Status: DC | PRN

## 2021-02-12 MED ORDER — TAMSULOSIN HCL 0.4 MG PO CAPS: 0.4000 mg | ORAL_CAPSULE | Freq: Every day | ORAL | 0 refills | Status: AC

## 2021-02-12 NOTE — Discharge Instructions (Addendum)
You have been seen in the Emergency Department (ED)  Today and was diagnosed with kidney stones. While the stone is traveling through the ureter, which is the tube that carries urine from the kidney to the bladder, you will probably feel pain. The pain may be mild or very severe. You may also have some blood in your urine. As soon as the stone reaches the bladder, any intense pain should go away. If a stone is too large to pass on its own, you may need a medical procedure to help you pass the stone.   As we have discussed, please drink plenty of fluids and use a urinary strainer to attempt to capture the stone.  Please make a follow up appointment with Urology in the next week by calling the number below and bring the stone with you.  Pain control: Take ibuprofen '600mg'$  every 6 hours for the pain. If the pain is not well controlled with ibuprofen you may take one percocet every 4 hours. Do not take tylenol while taking percocet. Please also take your prescribed flomax daily.   Follow-up with your doctor or return to the ER in 12-24 hours if your pain is not well controlled, if you develop pain or burning with urination, or if you develop a fever. Otherwise follow up in 3-5 days with your doctor.  When should you call for help?  Call your doctor now or seek immediate medical care if:  You cannot keep down fluids.  Your pain gets worse.  You have a fever or chills.  You have new or worse pain in your back just below your rib cage (the flank area).  You have new or more blood in your urine. You have pain or burning with urination You are unable to urinate You have abdominal pain  Watch closely for changes in your health, and be sure to contact your doctor if:  You do not get better as expected  How can you care for yourself at home?  Drink plenty of fluids, enough so that your urine is light yellow or clear like water. If you have kidney, heart, or liver disease and have to limit fluids, talk with  your doctor before you increase the amount of fluids you drink.  Take pain medicines exactly as directed. Call your doctor if you think you are having a problem with your medicine.  If the doctor gave you a prescription medicine for pain, take it as prescribed.  If you are not taking a prescription pain medicine, ask your doctor if you can take an over-the-counter medicine. Read and follow all instructions on the label. Your doctor may ask you to strain your urine so that you can collect your kidney stone when it passes. You can use a kitchen strainer or a tea strainer to catch the stone. Store it in a plastic bag until you see your doctor again.  Preventing future kidney stones  Some changes in your diet may help prevent kidney stones. Depending on the cause of your stones, your doctor may recommend that you:  Drink plenty of fluids, enough so that your urine is light yellow or clear like water. If you have kidney, heart, or liver disease and have to limit fluids, talk with your doctor before you increase the amount of fluids you drink.  Limit coffee, tea, and alcohol. Also avoid grapefruit juice.  Do not take more than the recommended daily dose of vitamins C and D.  Avoid antacids such as Gaviscon,  Maalox, Mylanta, or Tums.  Limit the amount of salt (sodium) in your diet.  Eat a balanced diet that is not too high in protein.  Limit foods that are high in a substance called oxalate, which can cause kidney stones. These foods include dark green vegetables, rhubarb, chocolate, wheat bran, nuts, cranberries, and beans.

## 2021-02-12 NOTE — ED Provider Notes (Signed)
[facility] Emergency Department Provider Note  ____________________________________________  Time seen: Approximately 3:09 AM  I have reviewed the triage vital signs and the nursing notes.   HISTORY  Chief Complaint Flank Pain   HPI Kristine Mcconnell is a 55 y.o. female with a history of hypertension and anemia who presents for evaluation of flank pain.  Patient reports sudden onset  of sharp severe left flank pain associated with nausea and vomiting.  Patient diaphoretic and complaining of 10 out of 10 pain.  Denies ever having similar pain.  No prior history of kidney stones.  No abdominal pain, no chest pain or shortness of breath.  No numbness or weakness of her lower extremities.  No dysuria or hematuria.  Past Medical History:  Diagnosis Date  . Anemia     Patient Active Problem List   Diagnosis Date Noted  . Essential hypertension 03/29/2020  . Class 2 severe obesity due to excess calories with serious comorbidity and body mass index (BMI) of 38.0 to 38.9 in adult ([place]) 03/29/2020  . Elevated TSH 08/20/2019  . Vitamin D deficiency 08/20/2019  . Sebaceous cyst 03/14/2019  . 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 Obesity (BMI 35.0-39.9 without comorbidity) 06/10/2016  . Light headedness 10/26/2015  . Right ankle pain 10/26/2015    Past Surgical History:  Procedure Laterality Date  . BREAST CYST ASPIRATION Left 1990   benign  . COLONOSCOPY WITH PROPOFOL N/A 08/03/2018   Procedure: COLONOSCOPY WITH PROPOFOL;  Surgeon: Lin Landsman, MD;  Location: [facility] ENDOSCOPY;  Service: Gastroenterology;  Laterality: N/A;  . DILATATION & CURETTAGE/HYSTEROSCOPY WITH MYOSURE N/A 06/09/2017   Procedure: DILATATION & CURETTAGE/HYSTEROSCOPY WITH MYOSURE RESECTION OF FIBROID;  Surgeon: Schermerhorn, Gwen Her, MD;  Location: [facility] ORS;  Service: Gynecology;  Laterality: N/A;  . DILATION AND CURETTAGE OF UTERUS    . FOOT SURGERY Right 2011  . HYSTEROSCOPY WITH NOVASURE  N/A 06/09/2017   Procedure: HYSTEROSCOPY WITH NOVASURE;  Surgeon: Schermerhorn, Gwen Her, MD;  Location: [facility] ORS;  Service: Gynecology;  Laterality: N/A;  . lapband  more than 10 years ago  . MASS EXCISION Right 03/17/2019   Procedure: EXCISION OF RIGHT AXILLARY LEISION;  Surgeon: Vickie Epley, MD;  Location: [place];  Service: Vascular;  Laterality: Right;  . TONSILLECTOMY  1977    Prior to Admission medications   Medication Sig Start Date End Date Taking? Authorizing Provider  ibuprofen (ADVIL) 600 MG tablet Take 1 tablet (600 mg total) by mouth every 6 (six) hours as needed. 02/12/21  Yes Alfred Levins, [place], MD  ondansetron (ZOFRAN ODT) 4 MG disintegrating tablet Take 1 tablet (4 mg total) by mouth every 8 (eight) hours as needed. 02/12/21  Yes Ibrohim Simmers, [place], MD  oxyCODONE-acetaminophen (PERCOCET) 5-325 MG tablet Take 1 tablet by mouth every 4 (four) hours as needed. 02/12/21  Yes Alfred Levins, [place], MD  tamsulosin (FLOMAX) 0.4 MG CAPS capsule Take 1 capsule (0.4 mg total) by mouth daily for 7 days. 02/12/21 02/19/21 Yes Ellington Greenslade, [place], MD  amLODipine (NORVASC) 5 MG tablet Take 1 tablet (5 mg total) by mouth daily. 07/31/20   Towanda Malkin, MD  amLODipine (NORVASC) 5 MG tablet Take 1 tablet (5 mg total) by mouth daily. 07/31/20   Towanda Malkin, MD    Allergies Other  Family History  Problem Relation Age of Onset  . Diabetes Mother   . Hypertension Mother   . Hypertension Father   . Breast cancer Neg Hx   . Colon  cancer Neg Hx     Social History Social History   Tobacco Use  . Smoking status: Never Smoker  . Smokeless tobacco: Never Used  Vaping Use  . Vaping Use: Never used  Substance Use Topics  . Alcohol use: Yes    Alcohol/week: 1.0 standard drink    Types: 1 Glasses of wine per week    Comment: OCC  . Drug use: No    Review of Systems  Constitutional: Negative for fever. Eyes: Negative for visual changes. ENT: Negative for  sore throat. Neck: No neck pain  Cardiovascular: Negative for chest pain. Respiratory: Negative for shortness of breath. Gastrointestinal: Negative for abdominal pain, diarrhea. + nausea and vomiting Genitourinary: Negative for dysuria. + L flank pain Musculoskeletal: Negative for back pain. Skin: Negative for rash. Neurological: Negative for headaches, weakness or numbness. Psych: No SI or HI  ____________________________________________   PHYSICAL EXAM:  VITAL SIGNS: ED Triage Vitals  Enc Vitals Group     BP 02/12/21 0053 (!) 167/81     Pulse Rate 02/12/21 0053 76     Resp 02/12/21 0053 (!) 22     Temp --      Temp Source 02/12/21 0053 Oral     SpO2 02/12/21 0053 96 %     Weight 02/12/21 0051 228 lb (103.4 kg)     Height 02/12/21 0051 5\' 5"  (1.651 m)     Head Circumference --      Peak Flow --      Pain Score 02/12/21 0051 10     Pain Loc --      Pain Edu? --      Excl. in [place]? --     Constitutional: Alert and oriented, diaphoretic and in distress due to pain.  HEENT:      Head: Normocephalic and atraumatic.         Eyes: Conjunctivae are normal. Sclera is non-icteric.       Mouth/Throat: Mucous membranes are moist.       Neck: Supple with no signs of meningismus. Cardiovascular: Regular rate and rhythm. No murmurs, gallops, or rubs. 2+ symmetrical distal pulses are present in all extremities. No JVD. Respiratory: Normal respiratory effort. Lungs are clear to auscultation bilaterally.  Gastrointestinal: Soft, non tender, and non distended with positive bowel sounds. No rebound or guarding. Genitourinary: No CVA tenderness. Musculoskeletal:  No edema, cyanosis, or erythema of extremities. Neurologic: Normal speech and language. Face is symmetric. Moving all extremities. No gross focal neurologic deficits are appreciated. Skin: Skin is warm, dry and intact. No rash noted. Psychiatric: Mood and affect are normal. Speech and behavior are  normal.  ____________________________________________   LABS (all labs ordered are listed, but only abnormal results are displayed)  Labs Reviewed  URINALYSIS, COMPLETE (UACMP) WITH MICROSCOPIC - Abnormal; Notable for the following components:      Result Value   Color, Urine YELLOW (*)    APPearance CLEAR (*)    Hgb urine dipstick MODERATE (*)    Bacteria, UA FEW (*)    All other components within normal limits  BASIC METABOLIC PANEL - Abnormal; Notable for the following components:   Potassium 2.8 (*)    Glucose, Bld 205 (*)    Calcium 8.8 (*)    All other components within normal limits  CBC - Abnormal; Notable for the following components:   Hemoglobin 11.3 (*)    HCT 35.3 (*)    MCV 79.0 (*)    MCH 25.3 (*)  All other components within normal limits   ____________________________________________  EKG  ED ECG REPORT I, Rudene Re, the attending physician, personally viewed and interpreted this ECG.  Sinus rhythm with a rate of 64, normal intervals, normal axis, T wave inversions anteriorly with no ST elevations or depressions. ____________________________________________  RADIOLOGY  I have personally reviewed the images performed during this visit and I agree with the Radiologist's read.   Interpretation by Radiologist:  CT Renal Stone Study  Result Date: 02/12/2021 CLINICAL DATA:  Sudden onset left-sided back EXAM: CT ABDOMEN AND PELVIS WITHOUT CONTRAST TECHNIQUE: Multidetector CT imaging of the abdomen and pelvis was performed following the standard protocol without IV contrast. COMPARISON:  None. FINDINGS: LOWER CHEST: Normal. HEPATOBILIARY: Normal hepatic contours. No intra- or extrahepatic biliary dilatation. The gallbladder is normal. PANCREAS: Normal pancreas. No ductal dilatation or peripancreatic fluid collection. SPLEEN: Normal. ADRENALS/URINARY TRACT: The adrenal glands are normal. There is a stone within the distal ureter measuring 2 mm, causing  mild hydroureter and mild perinephric stranding. Nonobstructive 2 mm calculus in the interpolar right kidney. STOMACH/BOWEL: Gastric band. No small bowel dilatation or inflammation. No focal colonic abnormality. Normal appendix. VASCULAR/LYMPHATIC: Normal course and caliber of the major abdominal vessels. No abdominal or pelvic lymphadenopathy. REPRODUCTIVE: 3.4 cm left adnexal cyst. MUSCULOSKELETAL. No bony spinal canal stenosis or focal osseous abnormality. OTHER: None. IMPRESSION: 1. Obstructing 2 mm stone within the distal left ureter causing mild hydroureter and mild perinephric stranding. 2. Nonobstructive 2 mm calculus in the interpolar right kidney. 3. 3.4 cm left adnexal cyst. Recommend follow-up US in 6-12 months. Note: This recommendation does not apply to premenarchal patients and to those with increased risk (genetic, family history, elevated tumor markers or other high-risk factors) of ovarian cancer. Reference: JACR 2020 Feb; 17(2):248-254 Electronically Signed   By: Ulyses Jarred M.D.   On: 02/12/2021 02:13    ____________________________________________   PROCEDURES  Procedure(s) performed:yes .1-3 Lead EKG Interpretation Performed by: Rudene Re, MD Authorized by: Rudene Re, MD     Interpretation: non-specific     ECG rate assessment: normal     Rhythm: sinus rhythm     Ectopy: none     Conduction: normal     Critical Care performed:  None ____________________________________________   INITIAL IMPRESSION / ASSESSMENT AND PLAN / ED COURSE  55 y.o. female with a history of hypertension and anemia who presents for evaluation of flank pain.  Patient arrived in significant distress due to pain.  No CVA tenderness, no abdominal tenderness, lower extremities are warm and well-perfused, no palpable masses.  CT showing 2 mm ureteral stone which is the cause of patient's pain.  UA with no signs of overlying UTI.  No AKI.  Mild hypokalemia which was supplemented p.o.  with no EKG changes.  Patient given IV fentanyl, IV Toradol, IV Zofran, and Flomax.  Pain is markedly improved.  Discussed dietary changes, hydration, pain and nausea control at home, and follow-up with PCP.  Discussed my standard return precautions.  _________________________ 3:44 AM on 02/12/2021 -----------------------------------------  Patient is pain-free will discharge home at this time.    _____________________________________________ Please note:  Patient was evaluated in Emergency Department today for the symptoms described in the history of present illness. Patient was evaluated in the context of the global COVID-19 pandemic, which necessitated consideration that the patient might be at risk for infection with the SARS-CoV-2 virus that causes COVID-19. Institutional protocols and algorithms that pertain to the evaluation of patients at risk for COVID-19  are in a state of rapid change based on information released by regulatory bodies including the CDC and federal and state organizations. These policies and algorithms were followed during the patient's care in the ED.  Some ED evaluations and interventions may be delayed as a result of limited staffing during the pandemic.   [place] Controlled Substance Database was reviewed by me. ____________________________________________   FINAL CLINICAL IMPRESSION(S) / ED DIAGNOSES   Final diagnoses:  Renal colic  Kidney stone      NEW MEDICATIONS STARTED DURING THIS VISIT:  ED Discharge Orders         Ordered    ibuprofen (ADVIL) 600 MG tablet  Every 6 hours PRN        02/12/21 0343    oxyCODONE-acetaminophen (PERCOCET) 5-325 MG tablet  Every 4 hours PRN        02/12/21 0343    ondansetron (ZOFRAN ODT) 4 MG disintegrating tablet  Every 8 hours PRN        02/12/21 0343    tamsulosin (FLOMAX) 0.4 MG CAPS capsule  Daily        02/12/21 0343           Note:  This document was prepared using Dragon voice recognition software and  may include unintentional dictation errors.    Rudene Re, MD 02/12/21 [phone]

## 2021-02-12 NOTE — ED Triage Notes (Signed)
Pt with sudden onset of left sided back pain approx 30 min pta. Pt diaphoretic, vomitting in distress.

## 2021-06-29 ENCOUNTER — Encounter: Payer: Self-pay | Admitting: General Surgery

## 2021-07-13 ENCOUNTER — Encounter: Payer: Self-pay | Admitting: Nurse Practitioner

## 2021-07-13 ENCOUNTER — Other Ambulatory Visit: Payer: Self-pay

## 2021-07-13 ENCOUNTER — Ambulatory Visit: Payer: Managed Care, Other (non HMO) | Admitting: Nurse Practitioner

## 2021-07-13 VITALS — BP 158/92 | HR 72 | Temp 97.6°F | Resp 18 | Ht 65.0 in | Wt 229.8 lb

## 2021-07-13 DIAGNOSIS — R142 Eructation: Secondary | ICD-10-CM

## 2021-07-13 DIAGNOSIS — L03818 Cellulitis of other sites: Secondary | ICD-10-CM | POA: Diagnosis not present

## 2021-07-13 DIAGNOSIS — N750 Cyst of Bartholin's gland: Secondary | ICD-10-CM | POA: Diagnosis not present

## 2021-07-13 MED ORDER — PANTOPRAZOLE SODIUM 40 MG PO TBEC: 40.0000 mg | DELAYED_RELEASE_TABLET | Freq: Every day | ORAL | 3 refills | Status: DC

## 2021-07-13 MED ORDER — CEPHALEXIN 500 MG PO CAPS: 500.0000 mg | ORAL_CAPSULE | Freq: Four times a day (QID) | ORAL | 0 refills | Status: AC

## 2021-07-13 NOTE — Progress Notes (Addendum)
BP (!) 158/92   Pulse 72   Temp 97.6 F (36.4 C) (Oral)   Resp 18   Ht 5\' 5"  (1.651 m)   Wt 229 lb 12.8 oz (104.2 kg)   SpO2 98%   BMI 38.24 kg/m    Subjective:    Patient ID: Kristine Mcconnell, female    DOB: [birth year], 55 y.o.   MRN: [id number]  HPI: Kristine Mcconnell is a 55 y.o. female, here alone  Chief Complaint  Patient presents with   Gastroesophageal Reflux    Recurrent burping that smells terrible   Vaginal Pain    Bump on labia for 3 days, pain and red with clothing touch   Burping/GERD: She says that she has been burping a lot for two years. She tried an over the counter acid reducer but did not notice a difference.  She says that she is burping all the time.  She was seen for same in 08/17/2019 by Raelyn Ensign NP.  She tried Pepcid and gas x with no relief. She denies any abdominal pain, change in bowel movements, weight loss or bloating. She says every once in awhile she does have heart burn.  Discussed doing a PPI with patient she is in agreement but still wants to go to GI for care. Referral placed for GI.   Bump on labia: Painful only with harder pressure.  No drainage, she says it is smaller than it was.  Bartholin cyst on right labia, no drainage, a small amount of cellulitis noted.  Will prescribe keflex.  Discussed doing sitz baths and warm compresses at least four times a day.   Relevant past medical, surgical, family and social history reviewed and updated as indicated. Interim medical history since our last visit reviewed. Allergies and medications reviewed and updated.  Review of Systems  Constitutional: Negative for fever or weight change.  Respiratory: Negative for cough and shortness of breath.   Cardiovascular: Negative for chest pain or palpitations.  Gastrointestinal: Negative for abdominal pain, no bowel changes. Positive for burping Musculoskeletal: Negative for gait problem or joint swelling.  Skin: Negative for rash. Positive for lump on right  labia Neurological: Negative for dizziness or headache.  No other specific complaints in a complete review of systems (except as listed in HPI above).      Objective:    BP (!) 158/92   Pulse 72   Temp 97.6 F (36.4 C) (Oral)   Resp 18   Ht 5\' 5"  (1.651 m)   Wt 229 lb 12.8 oz (104.2 kg)   SpO2 98%   BMI 38.24 kg/m   Wt Readings from Last 3 Encounters:  07/13/21 229 lb 12.8 oz (104.2 kg)  07/31/20 233 lb 8 oz (105.9 kg)  04/27/20 235 lb 8 oz (106.8 kg)    Physical Exam  Constitutional: Patient appears well-developed and well-nourished. Obese No distress.  HEENT: head atraumatic, normocephalic, pupils equal and reactive to light, neck supple, throat within normal limits Cardiovascular: Normal rate, regular rhythm and normal heart sounds.  No murmur heard. No BLE edema. Pulmonary/Chest: Effort normal and breath sounds normal. No respiratory distress. Abdominal: Soft.  There is no tenderness. Skin: Nickel sized bartholin cyst noted on right labia, about a 3x2 cm area of cellulitis. Psychiatric: Patient has a normal mood and affect. behavior is normal. Judgment and thought content normal.   Results for orders placed or performed in visit on 07/31/20  TSH  Result Value Ref Range   TSH 3.940  0.450 - 4.500 uIU/mL      Assessment & Plan:   1. Burping  - Ambulatory referral to Gastroenterology - pantoprazole (PROTONIX) 40 MG tablet; Take 1 tablet (40 mg total) by mouth daily.  Dispense: 30 tablet; Refill: 3  2. Bartholin cyst -sitz baths, warm compresses at least 4 times a day - cephALEXin (KEFLEX) 500 MG capsule; Take 1 capsule (500 mg total) by mouth 4 (four) times daily for 5 days.  Dispense: 20 capsule; Refill: 0  3. Cellulitis of other specified site  - cephALEXin (KEFLEX) 500 MG capsule; Take 1 capsule (500 mg total) by mouth 4 (four) times daily for 5 days.  Dispense: 20 capsule; Refill: 0   Follow up plan: Return if symptoms worsen or fail to improve.

## 2021-07-16 ENCOUNTER — Ambulatory Visit: Payer: Managed Care, Other (non HMO) | Admitting: Family Medicine

## 2021-08-30 ENCOUNTER — Other Ambulatory Visit: Payer: Self-pay | Admitting: Obstetrics and Gynecology

## 2021-08-30 DIAGNOSIS — Z1231 Encounter for screening mammogram for malignant neoplasm of breast: Secondary | ICD-10-CM

## 2021-09-05 ENCOUNTER — Ambulatory Visit (INDEPENDENT_AMBULATORY_CARE_PROVIDER_SITE_OTHER): Payer: Managed Care, Other (non HMO) | Admitting: Gastroenterology

## 2021-09-05 ENCOUNTER — Encounter: Payer: Self-pay | Admitting: Gastroenterology

## 2021-09-05 ENCOUNTER — Other Ambulatory Visit: Payer: Self-pay

## 2021-09-05 VITALS — BP 128/89 | HR 77 | Temp 97.4°F | Ht 65.0 in | Wt 232.0 lb

## 2021-09-05 DIAGNOSIS — R142 Eructation: Secondary | ICD-10-CM | POA: Diagnosis not present

## 2021-09-05 NOTE — Progress Notes (Signed)
Gastroenterology Consultation  Referring Provider:     Towanda Malkin* Primary Care Physician:  Towanda Malkin, MD Primary Gastroenterologist:  Dr. Allen Norris     Reason for Consultation:     Dyspepsia        HPI:   Kristine Mcconnell is a 55 y.o. y/o female referred for consultation & management of Dyspepsia by Dr. Roxan Hockey, Carola Frost, MD.  This patient comes in today after previously being seen by Dr. Marius Ditch back in 2019. The colonoscopy was reported to be normal and she is recommended to have a repeat colonoscopy in 10 years.  The patient had recently seen her primary care provider and reported that she had been having burping that she reported due to be very foul-smelling.  She had reported that it was going on for approximately 2 years.  She was put on a PPI which was Protonix 40 mg by her primary care provider back in October of this year. At the time of her visit with her primary care provider she was denying any abdominal pain associated with her symptoms. The patient denies any nausea vomiting fevers or chills.  She states that she does not smell her breath being bad but when she burps her son states that is very foul-smelling.  She thought that he was just trying to annoy her but when she started to burp without making any noise she states that he would continue to notice the bad odor in the car and over the windows. She cannot attribute to any particular food.  Past Medical History:  Diagnosis Date   Anemia     Past Surgical History:  Procedure Laterality Date   BREAST CYST ASPIRATION Left 1990   benign   COLONOSCOPY WITH PROPOFOL N/A 08/03/2018   Procedure: COLONOSCOPY WITH PROPOFOL;  Surgeon: Lin Landsman, MD;  Location[facility] ENDOSCOPY;  Service: Gastroenterology;  Laterality: N/A;   DILATATION & CURETTAGE/HYSTEROSCOPY WITH MYOSURE N/A 06/09/2017   Procedure: DILATATION & CURETTAGE/HYSTEROSCOPY WITH MYOSURE RESECTION OF FIBROID;  Surgeon: Schermerhorn, Gwen Her, MD;  Location: [facility] ORS;  Service: Gynecology;  Laterality: N/A;   DILATION AND CURETTAGE OF UTERUS     FOOT SURGERY Right 2011   HYSTEROSCOPY WITH NOVASURE N/A 06/09/2017   Procedure: HYSTEROSCOPY WITH NOVASURE;  Surgeon: Schermerhorn, Gwen Her, MD;  Location: [facility] ORS;  Service: Gynecology;  Laterality: N/A;   lapband  more than 10 years ago   MASS EXCISION Right 03/17/2019   Procedure: EXCISION OF RIGHT AXILLARY LEISION;  Surgeon: Vickie Epley, MD;  Location: [facility];  Service: Vascular;  Laterality: Right;   TONSILLECTOMY  1977    Prior to Admission medications   Medication Sig Start Date End Date Taking? Authorizing Provider  pantoprazole (PROTONIX) 40 MG tablet Take 1 tablet (40 mg total) by mouth daily. 07/13/21   Bo Merino, FNP    Family History  Problem Relation Age of Onset   Diabetes Mother    Hypertension Mother    Hypertension Father    Breast cancer Neg Hx    Colon cancer Neg Hx      Social History   Tobacco Use   Smoking status: Never   Smokeless tobacco: Never  Vaping Use   Vaping Use: Never used  Substance Use Topics   Alcohol use: Yes    Alcohol/week: 1.0 standard drink    Types: 1 Glasses of wine per week    Comment: OCC   Drug use: No  Allergies as of 09/05/2021 - Review Complete 07/13/2021  Allergen Reaction Noted   Other  06/05/2017    Review of Systems:    All systems reviewed and negative except where noted in HPI.   Physical Exam:  There were no vitals taken for this visit. No LMP recorded. (Menstrual status: Irregular Periods). General:   Alert,  Well-developed, well-nourished, pleasant and cooperative in NAD Head:  Normocephalic and atraumatic. Eyes:  Sclera clear, no icterus.   Conjunctiva pink. Ears:  Normal auditory acuity. Neck:  Supple; no masses or thyromegaly. Lungs:  Respirations even and unlabored.  Clear throughout to auscultation.   No wheezes, crackles, or rhonchi. No acute distress. Heart:   Regular rate and rhythm; no murmurs, clicks, rubs, or gallops. Abdomen:  Normal bowel sounds.  No bruits.  Soft, non-tender and non-distended without masses, hepatosplenomegaly or hernias noted.  No guarding or rebound tenderness.  Negative Carnett sign.   Rectal:  Deferred.  Pulses:  Normal pulses noted. Extremities:  No clubbing or edema.  No cyanosis. Neurologic:  Alert and oriented x3;  grossly normal neurologically. Skin:  Intact without significant lesions or rashes.  No jaundice. Lymph Nodes:  No significant cervical adenopathy. Psych:  Alert and cooperative. Normal mood and affect.  Imaging Studies: No results found.  Assessment and Plan:   Kristine Mcconnell is a 55 y.o. y/o female who comes in today with dyspepsia and she reports foul-smelling burps.  The patient will have her blood sent off for H. Pylori and she will have a small intestinal bacterial overgrowth breath test sent off for possible bacterial overgrowth as the cause of her symptoms. The patient does report frequent regurgitation and if these 2 tests are negative she may need to undergo a gastric emptying study and an upper endoscopy.  The patient has been explained the plan and agrees with it.    Lucilla Lame, MD. Marval Regal    Note: This dictation was prepared with Dragon dictation along with smaller phrase technology. Any transcriptional errors that result from this process are unintentional.

## 2021-09-11 LAB — H. PYLORI ANTIBODY, IGA: H. pylori, IgA Abs: <9 units (ref 0.0–8.9)

## 2021-09-19 ENCOUNTER — Telehealth: Payer: Self-pay

## 2021-09-19 NOTE — Telephone Encounter (Signed)
Pt notified of lab results

## 2021-09-19 NOTE — Telephone Encounter (Signed)
-----   Message from Lucilla Lame, MD sent at 09/11/2021  8:14 AM EST ----- Please let the patient know that the blood test for H. pylori was negative for a infection with this bacteria.

## 2021-10-31 ENCOUNTER — Other Ambulatory Visit: Payer: Self-pay

## 2021-10-31 ENCOUNTER — Ambulatory Visit
Admission: RE | Admit: 2021-10-31 | Discharge: 2021-10-31 | Disposition: A | Discharge status: Home / Self Care | Payer: Managed Care, Other (non HMO) | Source: Ambulatory Visit | Attending: Obstetrics and Gynecology | Admitting: Obstetrics and Gynecology

## 2021-10-31 DIAGNOSIS — Z1231 Encounter for screening mammogram for malignant neoplasm of breast: Secondary | ICD-10-CM | POA: Diagnosis present

## 2021-11-01 ENCOUNTER — Telehealth: Payer: Self-pay | Admitting: Gastroenterology

## 2021-11-01 NOTE — Telephone Encounter (Signed)
Pt is bringing in form to be completed  for her [employer] home kit. [phone]

## 2021-11-02 NOTE — Telephone Encounter (Signed)
Pt called requesting information about the SIBO testing and request form be filled out that she has... Advised pt that I will take care of order once Dx and order is verified... I will call pt once the form has been faxed   Please advise on Dx and type of testing you were wanting

## 2021-11-03 ENCOUNTER — Encounter: Payer: Self-pay | Admitting: Emergency Medicine

## 2021-11-03 ENCOUNTER — Other Ambulatory Visit: Payer: Self-pay

## 2021-11-03 ENCOUNTER — Emergency Department
Admission: EM | Admit: 2021-11-03 | Discharge: 2021-11-03 | Disposition: A | Discharge status: Home / Self Care | Payer: Managed Care, Other (non HMO) | Attending: Emergency Medicine | Admitting: Emergency Medicine

## 2021-11-03 DIAGNOSIS — N201 Calculus of ureter: Secondary | ICD-10-CM | POA: Diagnosis not present

## 2021-11-03 DIAGNOSIS — N23 Unspecified renal colic: Secondary | ICD-10-CM

## 2021-11-03 DIAGNOSIS — I10 Essential (primary) hypertension: Secondary | ICD-10-CM | POA: Diagnosis not present

## 2021-11-03 DIAGNOSIS — R1031 Right lower quadrant pain: Secondary | ICD-10-CM | POA: Diagnosis present

## 2021-11-03 LAB — COMPREHENSIVE METABOLIC PANEL
ALT: 16 U/L (ref 0–44)
AST: 20 U/L (ref 15–41)
Albumin: 4.3 g/dL (ref 3.5–5.0)
Alkaline Phosphatase: 103 U/L (ref 38–126)
Anion gap: 9 (ref 5–15)
BUN: 8 mg/dL (ref 6–20)
CO2: 24 mmol/L (ref 22–32)
Calcium: 8.9 mg/dL (ref 8.9–10.3)
Chloride: 104 mmol/L (ref 98–111)
Creatinine, Ser: 0.82 mg/dL (ref 0.44–1.00)
GFR, Estimated: >60 mL/min (ref >60)
Glucose, Bld: 190 mg/dL — ABNORMAL HIGH (ref 70–99)
Potassium: 4.1 mmol/L (ref 3.5–5.1)
Sodium: 137 mmol/L (ref 135–145)
Total Bilirubin: 0.8 mg/dL (ref 0.3–1.2)
Total Protein: 7.4 g/dL (ref 6.5–8.1)

## 2021-11-03 LAB — CBC WITH DIFFERENTIAL/PLATELET
Abs Immature Granulocytes: 0.02 10*3/uL (ref 0.00–0.07)
Basophils Absolute: 0 10*3/uL (ref 0.0–0.1)
Basophils Relative: 0 %
Eosinophils Absolute: 0 10*3/uL (ref 0.0–0.5)
Eosinophils Relative: 0 %
HCT: 39.5 % (ref 36.0–46.0)
Hemoglobin: 12 g/dL (ref 12.0–15.0)
Immature Granulocytes: 0 %
Lymphocytes Relative: 14 %
Lymphs Abs: 1.1 10*3/uL (ref 0.7–4.0)
MCH: 25.2 pg — ABNORMAL LOW (ref 26.0–34.0)
MCHC: 30.4 g/dL (ref 30.0–36.0)
MCV: 82.8 fL (ref 80.0–100.0)
Monocytes Absolute: 0.3 10*3/uL (ref 0.1–1.0)
Monocytes Relative: 4 %
Neutro Abs: 6.2 10*3/uL (ref 1.7–7.7)
Neutrophils Relative %: 82 %
Platelets: 314 10*3/uL (ref 150–400)
RBC: 4.77 MIL/uL (ref 3.87–5.11)
RDW: 13.8 % (ref 11.5–15.5)
WBC: 7.6 10*3/uL (ref 4.0–10.5)
nRBC: 0 % (ref 0.0–0.2)

## 2021-11-03 LAB — URINALYSIS, COMPLETE (UACMP) WITH MICROSCOPIC
Bilirubin Urine: NEGATIVE
Glucose, UA: 50 mg/dL — AB
Ketones, ur: NEGATIVE mg/dL
Leukocytes,Ua: NEGATIVE
Nitrite: NEGATIVE
Protein, ur: NEGATIVE mg/dL
Specific Gravity, Urine: 1.012 (ref 1.005–1.030)
pH: 7 (ref 5.0–8.0)

## 2021-11-03 MED ORDER — NAPROXEN 250 MG PO TABS: 250.0000 mg | ORAL_TABLET | Freq: Two times a day (BID) | ORAL | 0 refills | Status: AC

## 2021-11-03 MED ORDER — IBUPROFEN 400 MG PO TABS: 400.0000 mg | ORAL_TABLET | Freq: Four times a day (QID) | ORAL | 0 refills | Status: DC | PRN

## 2021-11-03 NOTE — ED Notes (Signed)
Called lab to add-on labs and UA

## 2021-11-03 NOTE — ED Triage Notes (Signed)
Pt via POV from home. Pt c/o R flank pain and nausea since this AM. Pt believes it is a kidney stone. Pt has a hx of kidney stones in the past and states this feels similar. Pt is A&Ox4 and NAD.

## 2021-11-03 NOTE — Discharge Instructions (Signed)
You likely had a kidney stone that may have passed your pain is controlled.  If your pain returns you can take naproxen ibuprofen 400 mg every 6 hours.  If you develop fevers uncontrolled nausea vomiting or uncontrolled pain, please return to the emergency department.

## 2021-11-03 NOTE — ED Provider Notes (Signed)
[facility] Provider Note    Event Date/Time   First MD Initiated Contact with Patient 11/03/21 1206     (approximate)   History   Flank Pain   HP  Kristine Mcconnell is a 56 y.o. female with past medical history of hypertension, obesity and kidney stones who presents with right-sided flank pain.  Symptoms started this morning.  Pain was initially very severe and she had several episodes of emesis.  Pain located in the right back/flank radiating around to the right lower abdomen.  She denies urinary symptoms or hematuria fevers chills.  Patient says this feels very similar to when she had a kidney stone in the past.  She took Tylenol before leaving.  Currently she is now pain-free and thinks she passed the stone.    Past Medical History:  Diagnosis Date   Anemia     Patient Active Problem List   Diagnosis Date Noted   Essential hypertension 03/29/2020   Class 2 severe obesity due to excess calories with serious comorbidity and body mass index (BMI) of 38.0 to 38.9 in adult ([place]) 03/29/2020   Elevated TSH 08/20/2019   Vitamin D deficiency 08/20/2019   Sebaceous cyst 03/14/2019   Absolute anemia 06/04/2018   Obesity (BMI 35.0-39.9 without comorbidity) 06/10/2016   Light headedness 10/26/2015   Right ankle pain 10/26/2015     Physical Exam  Triage Vital Signs: ED Triage Vitals  Enc Vitals Group     BP 11/03/21 1156 (!) 147/100     Pulse Rate 11/03/21 1156 65     Resp 11/03/21 1156 18     Temp 11/03/21 1154 (!) 97.5 F (36.4 C)     Temp src --      SpO2 11/03/21 1156 99 %     Weight 11/03/21 1153 229 lb (103.9 kg)     Height 11/03/21 1153 5\' 5"  (1.651 m)     Head Circumference --      Peak Flow --      Pain Score 11/03/21 1153 7     Pain Loc --      Pain Edu? --      Excl. in [place]? --     Most recent vital signs: Vitals:   11/03/21 1156 11/03/21 1230  BP: (!) 147/100 (!) 191/87  Pulse: 65 64  Resp: 18 18  Temp:    SpO2: 99% 100%      General: Awake, no distress.  CV:  Good peripheral perfusion.  Resp:  Normal effort.  Abd:  No distention.  Soft, no tenderness in the right lower quadrant Neuro:             Awake, Alert, Oriented x 3  Other:     ED Results / Procedures / Treatments  Labs (all labs ordered are listed, but only abnormal results are displayed) Labs Reviewed  CBC WITH DIFFERENTIAL/PLATELET - Abnormal; Notable for the following components:      Result Value   MCH 25.2 (*)    All other components within normal limits  COMPREHENSIVE METABOLIC PANEL - Abnormal; Notable for the following components:   Glucose, Bld 190 (*)    All other components within normal limits  URINALYSIS, COMPLETE (UACMP) WITH MICROSCOPIC - Abnormal; Notable for the following components:   Color, Urine STRAW (*)    APPearance CLEAR (*)    Glucose, UA 50 (*)    Hgb urine dipstick MODERATE (*)    Bacteria, UA RARE (*)  All other components within normal limits     EKG     RADIOLOGY    PROCEDURES:  Critical Care performed: No  .1-3 Lead EKG Interpretation Performed by: Rada Hay, MD Authorized by: Rada Hay, MD     Interpretation: normal     ECG rate assessment: normal     Ectopy: none     Conduction: normal    The patient is on the cardiac monitor to evaluate for evidence of arrhythmia and/or significant heart rate changes.   MEDICATIONS ORDERED IN ED: Medications - No data to display   IMPRESSION / MDM / [place] / ED COURSE  I reviewed the triage vital signs and the nursing notes.                              Differential diagnosis includes, but is not limited to, renal colic, Pyelo, passed kidney stone, musculoskeletal  Patient is a 56 year old female with a history of kidney stones who presents with right-sided flank pain.  Started this morning and she had associated vomiting and pain was quite severe.  After taking Tylenol now she is pain-free.  She thinks she  may have passed a stone.  Her CBC and CMP are reassuring.  She has no leukocytosis.  She is notably hypertensive but otherwise vitals are within normal limits.  She appears comfortable abdomen is soft and nontender throughout.  UA with RBCs, 0-5 WBCs so not consistent with infection.  I do suspect that she had a stone.  Is possible that her pain is just controlled but with only having taken Tylenol I suspect she may have passed a stone.  Discussed with the patient stating a CT renal study versus assuming she passed it and she prefers not to get the CT at this time.  We discussed taking ibuprofen if pain comes back and discussed return precautions for fever, uncontrolled pain uncontrolled nausea vomiting.  She was given urology follow-up.  We discussed getting a home BP cuff checking it once per week and following up with her primary care provider.      FINAL CLINICAL IMPRESSION(S) / ED DIAGNOSES   Final diagnoses:  Ureteral colic     Rx / DC Orders   ED Discharge Orders          Ordered    ibuprofen (ADVIL) 400 MG tablet  Every 6 hours PRN        11/03/21 1349             Note:  This document was prepared using Dragon voice recognition software and may include unintentional dictation errors.   Rada Hay, MD 11/03/21 1352

## 2021-11-05 NOTE — Telephone Encounter (Signed)
Form has been faxed along with demographics and insurance card   Left message on voicemail

## 2021-11-27 ENCOUNTER — Ambulatory Visit: Payer: Self-pay | Admitting: *Deleted

## 2021-11-27 NOTE — Telephone Encounter (Signed)
? ? ? ?  Chief Complaint: Hypertension ?Symptoms: BP 178/103  this AM ?Frequency: Today ?Pertinent Negatives: Patient denies no associated symptoms. No H/O HTN, no meds. ?Disposition: '[]'$ ED /'[]'$ Urgent Care (no appt availability in office) / '[x]'$ Appointment(In office/virtual)/ '[]'$  [facility] [facility]/ '[]'$ Home Care/ '[]'$ Refused Recommended Disposition /'[]'$ [facility] [facility]/ '[]'$  Follow-up with PCP ?Additional Notes: Appt secured, care advise given,,verbalizes understanding.  Reason for Disposition ? Systolic BP  >= 076 OR Diastolic >= 226 ? ?Answer Assessment - Initial Assessment Questions ?1. BLOOD PRESSURE: "What is the blood pressure?" "Did you take at least two measurements 5 minutes apart?" ?    178/103    173/93 ?2. ONSET: "When did you take your blood pressure?" ?    This AM ?3. HOW: "How did you obtain the blood pressure?" (e.g., visiting nurse, automatic home BP monitor) ?    Home, arm cuff ?4. HISTORY: "Do you have a history of high blood pressure?" ?    No ?5. MEDICATIONS: "Are you taking any medications for blood pressure?" "Have you missed any doses recently?" ?    NA ?6. OTHER SYMPTOMS: "Do you have any symptoms?" (e.g., headache, chest pain, blurred vision, difficulty breathing, weakness) ?    No ? ?Protocols used: Blood Pressure - High-A-AH ? ?

## 2021-11-28 ENCOUNTER — Encounter: Payer: Self-pay | Admitting: Family Medicine

## 2021-11-28 ENCOUNTER — Ambulatory Visit: Payer: Managed Care, Other (non HMO) | Admitting: Family Medicine

## 2021-11-28 VITALS — BP 160/88 | HR 98 | Temp 98.2°F | Resp 16 | Ht 65.0 in | Wt 237.4 lb

## 2021-11-28 DIAGNOSIS — I10 Essential (primary) hypertension: Secondary | ICD-10-CM | POA: Diagnosis not present

## 2021-11-28 DIAGNOSIS — R7989 Other specified abnormal findings of blood chemistry: Secondary | ICD-10-CM | POA: Diagnosis not present

## 2021-11-28 DIAGNOSIS — Z6838 Body mass index (BMI) 38.0-38.9, adult: Secondary | ICD-10-CM | POA: Diagnosis not present

## 2021-11-28 MED ORDER — AMLODIPINE BESYLATE 5 MG PO TABS: 5.0000 mg | ORAL_TABLET | Freq: Every day | ORAL | 1 refills | Status: DC

## 2021-11-28 NOTE — Assessment & Plan Note (Signed)
Recheck labs 

## 2021-11-28 NOTE — Assessment & Plan Note (Signed)
Likely contributing to higher BP. Recommend weight loss through diet and exercise. ?

## 2021-11-28 NOTE — Assessment & Plan Note (Signed)
Chronic, elevated today. Restart amlodipine. Obtaining labs. F/u 1-2 months. ?

## 2021-11-28 NOTE — Progress Notes (Signed)
? ?  SUBJECTIVE:  ? ?CHIEF COMPLAINT / HPI:  ? ?Hypertension: ?- Medications: none. Previously on amlodipine '5mg'$ . ?- Compliance: n/a ?- Checking BP at home: yes, noting higher BP over the past 3 weeks. 170s SBP. ?- ED visit 06/16/25 for renal colic with elevated BP to 180s SBP. ?- Denies any SOB, CP, vision changes, LE edema, or symptoms of hypotension ? ?Abnormal TSH - h/o elevated TSH. Last TSH 07/2020 wnl. ? ? ?OBJECTIVE:  ? ?BP (!) 160/88   Pulse 98   Temp 98.2 ?F (36.8 ?C) (Oral)   Resp 16   Ht '5\' 5"'$  (1.651 m)   Wt 237 lb 6.4 oz (107.7 kg)   SpO2 99%   BMI 39.51 kg/m?   ?Gen: well appearing, in NAD ?Card: RRR ?Lungs: CTAB ?Ext: WWP, no edema ? ?ASSESSMENT/PLAN:  ? ?Essential hypertension ?Chronic, elevated today. Restart amlodipine. Obtaining labs. F/u 1-2 months. ? ?Class 2 severe obesity due to excess calories with serious comorbidity and body mass index (BMI) of 38.0 to 38.9 in adult [facility]) ?Likely contributing to higher BP. Recommend weight loss through diet and exercise. ? ?Elevated TSH ?Recheck labs. ?  ? ? ?Myles Gip, DO ?

## 2021-11-28 NOTE — Patient Instructions (Signed)
It was great to see you! ? ?Our plans for today:  ?- We are restarting amlodipine. Keep an eye on your blood pressure and document your measurements. Bring your log with you to follow up.  ?- Come back in 1-2 months.  ? ?We are checking some labs today, we will release these results to your MyChart. ? ?Take care and seek immediate care sooner if you develop any concerns.  ? ?Dr. Ky Barban ? ?

## 2021-11-29 LAB — BASIC METABOLIC PANEL
BUN/Creatinine Ratio: 15 (ref 9–23)
BUN: 10 mg/dL (ref 6–24)
CO2: 24 mmol/L (ref 20–29)
Calcium: 9.2 mg/dL (ref 8.7–10.2)
Chloride: 105 mmol/L (ref 96–106)
Creatinine, Ser: 0.67 mg/dL (ref 0.57–1.00)
Glucose: 130 mg/dL — ABNORMAL HIGH (ref 70–99)
Potassium: 4.7 mmol/L (ref 3.5–5.2)
Sodium: 143 mmol/L (ref 134–144)
eGFR: 104 mL/min/{1.73_m2} (ref >59)

## 2021-11-29 LAB — TSH: TSH: 6.55 u[IU]/mL — ABNORMAL HIGH (ref 0.450–4.500)

## 2021-11-30 NOTE — Addendum Note (Signed)
Addended by: Myles Gip on: 11/30/2021 07:20 PM ? ? Modules accepted: Orders ? ?

## 2021-12-03 ENCOUNTER — Other Ambulatory Visit: Payer: Self-pay

## 2021-12-03 DIAGNOSIS — R7989 Other specified abnormal findings of blood chemistry: Secondary | ICD-10-CM

## 2021-12-04 ENCOUNTER — Other Ambulatory Visit: Payer: Self-pay | Admitting: Family Medicine

## 2021-12-04 DIAGNOSIS — R42 Dizziness and giddiness: Secondary | ICD-10-CM

## 2021-12-04 DIAGNOSIS — R7989 Other specified abnormal findings of blood chemistry: Secondary | ICD-10-CM

## 2021-12-04 LAB — THYROID PANEL WITH TSH
Free Thyroxine Index: 1.9 (ref 1.2–4.9)
T3 Uptake Ratio: 22 % — ABNORMAL LOW (ref 24–39)
T4, Total: 8.8 ug/dL (ref 4.5–12.0)
TSH: 4.45 u[IU]/mL (ref 0.450–4.500)

## 2021-12-04 MED ORDER — LEVOTHYROXINE SODIUM 50 MCG PO TABS: 50.0000 ug | ORAL_TABLET | Freq: Every day | ORAL | 0 refills | Status: DC

## 2021-12-13 ENCOUNTER — Telehealth: Payer: Self-pay

## 2021-12-13 NOTE — Telephone Encounter (Signed)
Copy of results placed on your desk... You can shred when you are finished ?

## 2021-12-21 NOTE — Telephone Encounter (Signed)
Left detailed msg on VM per HIPAA  

## 2021-12-25 ENCOUNTER — Ambulatory Visit: Payer: Managed Care, Other (non HMO) | Admitting: Nurse Practitioner

## 2021-12-25 ENCOUNTER — Other Ambulatory Visit: Payer: Self-pay

## 2021-12-25 ENCOUNTER — Encounter: Payer: Self-pay | Admitting: Nurse Practitioner

## 2021-12-25 VITALS — BP 136/68 | HR 83 | Temp 97.8°F | Resp 16 | Ht 65.0 in | Wt 234.1 lb

## 2021-12-25 DIAGNOSIS — E039 Hypothyroidism, unspecified: Secondary | ICD-10-CM

## 2021-12-25 DIAGNOSIS — I10 Essential (primary) hypertension: Secondary | ICD-10-CM | POA: Diagnosis not present

## 2021-12-25 NOTE — Progress Notes (Signed)
? ?  BP 136/68   Pulse 83   Temp 97.8 ?F (36.6 ?C) (Oral)   Resp 16   Ht '5\' 5"'$  (1.651 m)   Wt 234 lb 1.6 oz (106.2 kg)   SpO2 99%   BMI 38.96 kg/m?   ? ?Subjective:  ? ? Patient ID: Kristine Mcconnell, female    DOB: 07-10-1966, 56 y.o.   MRN: [id number] ? ?HPI: ?Kristine Mcconnell is a 56 y.o. female ? ?Chief Complaint  ?Patient presents with  ? Follow-up  ? Hypertension  ? ?Hypertension: She is here for a blood pressure follow up. Her blood pressure was 160/88 on 11/28/2021.  She was restarted on amlodipine which she says she has done fine with. She denies any chest pain, shortnes of breath, headaches or blurred vision. She has been monitoring her blood pressures at home and they have been anywhere form 120s-140s-70-80s.  Will continue with current treatment.  ? ?Hypothyroidism:  She was recently restarted on levothyroxine 50 mcg.  She is going to get her thyroid panel checked again in a few weeks. She says she feels good and has had no palpitations, weight loss or gain, or constipation.  Will await test results and adjust as needed.  ? ?Relevant past medical, surgical, family and social history reviewed and updated as indicated. Interim medical history since our last visit reviewed. ?Allergies and medications reviewed and updated. ? ?Review of Systems ? ?Constitutional: Negative for fever or weight change.  ?Respiratory: Negative for cough and shortness of breath.   ?Cardiovascular: Negative for chest pain or palpitations.  ?Gastrointestinal: Negative for abdominal pain, no bowel changes.  ?Musculoskeletal: Negative for gait problem or joint swelling.  ?Skin: Negative for rash.  ?Neurological: Negative for dizziness or headache.  ?No other specific complaints in a complete review of systems (except as listed in HPI above).  ? ?   ?Objective:  ?  ?BP 136/68   Pulse 83   Temp 97.8 ?F (36.6 ?C) (Oral)   Resp 16   Ht '5\' 5"'$  (1.651 m)   Wt 234 lb 1.6 oz (106.2 kg)   SpO2 99%   BMI 38.96 kg/m?   ?Wt Readings from Last 3  Encounters:  ?12/25/21 234 lb 1.6 oz (106.2 kg)  ?11/28/21 237 lb 6.4 oz (107.7 kg)  ?11/03/21 229 lb (103.9 kg)  ?  ?Physical Exam ? ?Constitutional: Patient appears well-developed and well-nourished. Obese  No distress.  ?HEENT: head atraumatic, normocephalic, pupils equal and reactive to light,  neck supple ?Cardiovascular: Normal rate, regular rhythm and normal heart sounds.  No murmur heard. No BLE edema. ?Pulmonary/Chest: Effort normal and breath sounds normal. No respiratory distress. ?Abdominal: Soft.  There is no tenderness. ?Psychiatric: Patient has a normal mood and affect. behavior is normal. Judgment and thought content normal.  ?Results for orders placed or performed in visit on 12/03/21  ?Thyroid Panel With TSH  ?Result Value Ref Range  ? TSH 4.450 0.450 - 4.500 uIU/mL  ? T4, Total 8.8 4.5 - 12.0 ug/dL  ? T3 Uptake Ratio 22 (L) 24 - 39 %  ? Free Thyroxine Index 1.9 1.2 - 4.9  ? ?   ?Assessment & Plan:  ? ?1. Essential hypertension ?-continue current treatment plan ? ?2. Hypothyroidism, unspecified type ?-will await test results and adjust medication as appropriate ? ?Follow up plan: ? ?Follow up dependent on thyroid panel results.  ? ? ? ? ? ?

## 2022-01-18 LAB — THYROID PANEL WITH TSH
Free Thyroxine Index: 2.1 (ref 1.2–4.9)
T3 Uptake Ratio: 22 % — ABNORMAL LOW (ref 24–39)
T4, Total: 9.7 ug/dL (ref 4.5–12.0)
TSH: 2.12 u[IU]/mL (ref 0.450–4.500)

## 2022-01-18 LAB — THYROID PEROXIDASE ANTIBODY: Thyroperoxidase Ab SerPl-aCnc: <9 IU/mL (ref 0–34)

## 2022-02-13 ENCOUNTER — Ambulatory Visit: Payer: Managed Care, Other (non HMO) | Admitting: Nurse Practitioner

## 2022-02-13 ENCOUNTER — Encounter: Payer: Self-pay | Admitting: Nurse Practitioner

## 2022-02-13 VITALS — BP 130/76 | HR 82 | Temp 97.9°F | Resp 16 | Ht 65.0 in | Wt 233.3 lb

## 2022-02-13 DIAGNOSIS — Z6838 Body mass index (BMI) 38.0-38.9, adult: Secondary | ICD-10-CM

## 2022-02-13 DIAGNOSIS — M25561 Pain in right knee: Secondary | ICD-10-CM | POA: Diagnosis not present

## 2022-02-13 DIAGNOSIS — E039 Hypothyroidism, unspecified: Secondary | ICD-10-CM

## 2022-02-13 DIAGNOSIS — M25551 Pain in right hip: Secondary | ICD-10-CM | POA: Diagnosis not present

## 2022-02-13 DIAGNOSIS — G8929 Other chronic pain: Secondary | ICD-10-CM

## 2022-02-13 MED ORDER — SEMAGLUTIDE-WEIGHT MANAGEMENT 0.25 MG/0.5ML ~~LOC~~ SOAJ: 0.2500 mg | SUBCUTANEOUS | 0 refills | Status: AC

## 2022-02-13 NOTE — Assessment & Plan Note (Signed)
Last TSH was normal.  Continue taking levothyroxine 50 mcg daily.  Recheck TSH in 3 months.

## 2022-02-13 NOTE — Assessment & Plan Note (Signed)
She would like to try Kristine Mcconnell.  Discussed side effects and administration.  Patient denies any personal history of pancreatitis or family history of thyroid cancer.  [facility] prescription sent to pharmacy.

## 2022-02-13 NOTE — Progress Notes (Addendum)
BP 130/76   Pulse 82   Temp 97.9 F (36.6 C)   Resp 16   Ht '5\' 5"'$  (1.651 m)   Wt 233 lb 4.8 oz (105.8 kg)   SpO2 100%   BMI 38.82 kg/m    Subjective:    Patient ID: Kristine Mcconnell, female    DOB: June 19, 1966, 56 y.o.   MRN: [id number]  HPI: Kristine Mcconnell is a 56 y.o. female  Chief Complaint  Patient presents with   Obesity    Discuss weight loss   Obesity: Patient current weight is 233 pounds with a BMI of 38.82.  Patient here to discuss weight loss options.  Her highest weight was 250 lbs.  She has tried exercise, diet, phen-phen, [employer] and [employer].  She denies any history of pancreatitis or family history of thyroid cancer.  Patient would like to try [facility].  Discussed side effects and administration.   Right hip pain/ right knee pain:  She says that she has been having right hip  for a couple of years.  She says that she has been having right knee pain for about three weeks.  She says that she has been dealing with the pain.  She says the pain comes and goes and that she says it just feel stiff right now. She denies any trauma.  She denies any numbness or tingling.  She denies any back pain.  Discussed trying Voltaren gel.  If no improvement will refer to orthopedics.  Hypothyroidism: Last TSH was done on 01/17/2022.  Her thyroid is normal at that time.  She is currently taking levothyroxine 50 mcg daily.  Discussed patient getting thyroid checked in 3 months.  Order placed she gets her blood drawn at [place].  Relevant past medical, surgical, family and social history reviewed and updated as indicated. Interim medical history since our last visit reviewed. Allergies and medications reviewed and updated.  Review of Systems  Constitutional: Negative for fever or weight change.  Respiratory: Negative for cough and shortness of breath.   Cardiovascular: Negative for chest pain or palpitations.  Gastrointestinal: Negative for abdominal pain, no bowel changes.   Musculoskeletal: Negative for gait problem or joint swelling.  Skin: Negative for rash.  Neurological: Negative for dizziness or headache.  No other specific complaints in a complete review of systems (except as listed in HPI above).      Objective:    BP 130/76   Pulse 82   Temp 97.9 F (36.6 C)   Resp 16   Ht '5\' 5"'$  (1.651 m)   Wt 233 lb 4.8 oz (105.8 kg)   SpO2 100%   BMI 38.82 kg/m   Wt Readings from Last 3 Encounters:  02/13/22 233 lb 4.8 oz (105.8 kg)  12/25/21 234 lb 1.6 oz (106.2 kg)  11/28/21 237 lb 6.4 oz (107.7 kg)    Physical Exam  Constitutional: Patient appears well-developed and well-nourished. Obese  No distress.  HEENT: head atraumatic, normocephalic, pupils equal and reactive to light, neck supple Cardiovascular: Normal rate, regular rhythm and normal heart sounds.  No murmur heard. No BLE edema. Pulmonary/Chest: Effort normal and breath sounds normal. No respiratory distress. Abdominal: Soft.  There is no tenderness. Psychiatric: Patient has a normal mood and affect. behavior is normal. Judgment and thought content normal.  Results for orders placed or performed in visit on 12/04/21  Thyroid Panel With TSH  Result Value Ref Range   TSH 2.120 0.450 - 4.500 uIU/mL  T4, Total 9.7 4.5 - 12.0 ug/dL   T3 Uptake Ratio 22 (L) 24 - 39 %   Free Thyroxine Index 2.1 1.2 - 4.9  Thyroid peroxidase antibody  Result Value Ref Range   Thyroperoxidase Ab SerPl-aCnc <9 0 - 34 IU/mL      Assessment & Plan:   Problem List Items Addressed This Visit       Endocrine   Hypothyroidism    Last TSH was normal.  Continue taking levothyroxine 50 mcg daily.  Recheck TSH in 3 months.       Relevant Orders   TSH     Other   Class 2 severe obesity due to excess calories with serious comorbidity and body mass index (BMI) of 38.0 to 38.9 in adult [facility]) - Primary    She would like to try [facility].  Discussed side effects and administration.  Patient denies any personal  history of pancreatitis or family history of thyroid cancer.  [facility] prescription sent to pharmacy.       Relevant Medications   Semaglutide-Weight Management 0.25 MG/0.5ML SOAJ   Other Visit Diagnoses     Chronic right hip pain       Patient reports right hip pain for 2 years.  No new trauma.  We will try Voltaren gel.  If no improvement will send to orthopedics.   Acute pain of right knee       Right knee pain x3 weeks, no  trauma.  We will try Voltaren gel.  If no improvement will send to orthopedics.        Follow up plan: Return in about 4 weeks (around 03/13/2022) for follow up.

## 2022-02-19 ENCOUNTER — Other Ambulatory Visit: Payer: Self-pay | Admitting: Family Medicine

## 2022-02-20 NOTE — Telephone Encounter (Signed)
Requested Prescriptions  Pending Prescriptions Disp Refills  . levothyroxine (SYNTHROID) 50 MCG tablet [Pharmacy Med Name: LEVOTHYROXINE 50 MCG TABLET] 90 tablet 0    Sig: TAKE 1 TABLET BY MOUTH EVERY DAY     Endocrinology:  Hypothyroid Agents Passed - 02/19/2022  1:42 AM      Passed - TSH in normal range and within 360 days    TSH  Date Value Ref Range Status  01/17/2022 2.120 0.450 - 4.500 uIU/mL Final         Passed - Valid encounter within last 12 months    Recent Outpatient Visits          1 week ago Class 2 severe obesity due to excess calories with serious comorbidity and body mass index (BMI) of 38.0 to 38.9 in adult [facility])   [facility] Bo Merino, FNP   1 month ago Essential hypertension   [facility] Bo Merino, FNP   2 months ago Essential hypertension   [place], DO   7 months ago Snyder, Julie F, FNP   1 year ago Essential hypertension   [facility] Towanda Malkin, MD

## 2022-04-12 ENCOUNTER — Telehealth: Payer: Self-pay | Admitting: Nurse Practitioner

## 2022-04-12 NOTE — Telephone Encounter (Signed)
FYI

## 2022-04-12 NOTE — Telephone Encounter (Signed)
Note patient stated at last office visit PCP was suppose to send in prescription to see if it would be covered through insurance. Patient states that was a month ago and would like a follow up call.   Medication Refill - Medication: OZEMPIC  Has the patient contacted their pharmacy? No.  (Agent: If no, request that the patient contact the pharmacy for the refill. If patient does not wish to contact the pharmacy document the reason why and proceed with request.)    Preferred Pharmacy (with phone number or street name):   [facility] #[id number] [address], [place] Phone:  [phone]  Fax:  [phone]      Has the patient been seen for an appointment in the last year OR does the patient have an upcoming appointment? Yes.    Agent: Please be advised that RX refills may take up to 3 business days. We ask that you follow-up with your pharmacy.

## 2022-04-12 NOTE — Telephone Encounter (Signed)
Have you seen this one

## 2022-04-15 ENCOUNTER — Other Ambulatory Visit: Payer: Self-pay | Admitting: Family Medicine

## 2022-04-16 NOTE — Telephone Encounter (Signed)
Requested Prescriptions  Pending Prescriptions Disp Refills  . levothyroxine (SYNTHROID) 50 MCG tablet [Pharmacy Med Name: LEVOTHYROXINE 50 MCG TABLET] 90 tablet 2    Sig: TAKE 1 TABLET BY MOUTH EVERY DAY (MAIL ORDER REQ OR [facility])     Endocrinology:  Hypothyroid Agents Passed - 04/15/2022 11:31 AM      Passed - TSH in normal range and within 360 days    TSH  Date Value Ref Range Status  01/17/2022 2.120 0.450 - 4.500 uIU/mL Final         Passed - Valid encounter within last 12 months    Recent Outpatient Visits          2 months ago Class 2 severe obesity due to excess calories with serious comorbidity and body mass index (BMI) of 38.0 to 38.9 in adult [facility])   [facility] Bo Merino, FNP   3 months ago Essential hypertension   [facility] Bo Merino, FNP   4 months ago Essential hypertension   [place], DO   9 months ago [place], Julie F, FNP   1 year ago Essential hypertension   [facility] Towanda Malkin, MD

## 2022-06-27 ENCOUNTER — Other Ambulatory Visit: Payer: Self-pay | Admitting: Nurse Practitioner

## 2022-06-27 NOTE — Telephone Encounter (Signed)
Patient called in to discuss status of Ozempic. If it is on backorder is there another alternative. Please call back

## 2022-06-27 NOTE — Telephone Encounter (Signed)
Medication Refill - Medication: amLODipine (NORVASC) 5 MG  Patient requesting 90 day supply because less expensive then 30 day  Has the patient contacted their pharmacy? yes (Agent: If no, request that the patient contact the pharmacy for the refill. If patient does not wish to contact the pharmacy document the reason why and proceed with request.) (Agent: If yes, when and what did the pharmacy advise?)contact pcp  Preferred Pharmacy (with phone number or street name):  [facility] #[id number] [address], [place] Phone:  [phone]  Fax:  [phone]     Has the patient been seen for an appointment in the last year OR does the patient have an upcoming appointment? yes  Agent: Please be advised that RX refills may take up to 3 business days. We ask that you follow-up with your pharmacy.

## 2022-06-28 MED ORDER — AMLODIPINE BESYLATE 5 MG PO TABS: 5.0000 mg | ORAL_TABLET | Freq: Every day | ORAL | 0 refills | Status: DC

## 2022-06-28 NOTE — Telephone Encounter (Signed)
Last rx was in may, could not pull up in cover my meds so started another PA.  States they could not find patient so helen will have to call her insruance.

## 2022-06-28 NOTE — Telephone Encounter (Signed)
Requested Prescriptions  Pending Prescriptions Disp Refills  . amLODipine (NORVASC) 5 MG tablet 90 tablet 0    Sig: Take 1 tablet (5 mg total) by mouth daily.     Cardiovascular: Calcium Channel Blockers 2 Passed - 06/27/2022  4:39 PM      Passed - Last BP in normal range    BP Readings from Last 1 Encounters:  02/13/22 130/76         Passed - Last Heart Rate in normal range    Pulse Readings from Last 1 Encounters:  02/13/22 82         Passed - Valid encounter within last 6 months    Recent Outpatient Visits          4 months ago Class 2 severe obesity due to excess calories with serious comorbidity and body mass index (BMI) of 38.0 to 38.9 in adult [facility])   [facility] Bo Merino, FNP   6 months ago Essential hypertension   [facility] Bo Merino, FNP   7 months ago Essential hypertension   [place], DO   11 months ago [place], Julie F, FNP   1 year ago Essential hypertension   [facility] Towanda Malkin, MD

## 2022-07-01 ENCOUNTER — Telehealth: Payer: Self-pay | Admitting: Nurse Practitioner

## 2022-07-01 NOTE — Telephone Encounter (Signed)
Is she getting Ozempic or Wegovy. Wegovy both pharmacy's listed do not have. She originally asked for Ozempic. Need to know before calling insurance company. Cover my med will not accept a PA for her. Has reached out to patient to discuss

## 2022-07-01 NOTE — Telephone Encounter (Signed)
Medication Refill - Medication: amlodipine Patient requesting 90 day supply for insurance purposes  Has the patient contacted their pharmacy? yes (Agent: If no, request that the patient contact the pharmacy for the refill. If patient does not wish to contact the pharmacy document the reason why and proceed with request.) (Agent: If yes, when and what did the pharmacy advise?)contact pcp  Preferred Pharmacy (with phone number or street name):  [facility] #[id number] [address], [place] Phone:  [phone]  Fax:  [phone]     Has the patient been seen for an appointment in the last year OR does the patient have an upcoming appointment? yes  Agent: Please be advised that RX refills may take up to 3 business days. We ask that you follow-up with your pharmacy.

## 2022-07-01 NOTE — Telephone Encounter (Signed)
Has tried putting in medication with cover my med they will not accept. Will call patient to verify insurance and call them directly

## 2022-07-02 ENCOUNTER — Other Ambulatory Visit: Payer: Self-pay | Admitting: Emergency Medicine

## 2022-07-02 NOTE — Telephone Encounter (Signed)
Spoke to [employer], they stated patient benefits is not under that insurance. Called [employer] and put in [place] they stated it is pending review for the [facility] and we will receive a letter

## 2022-07-02 NOTE — Telephone Encounter (Signed)
Medication pend to Julie,patient advised she need appointment

## 2022-07-09 ENCOUNTER — Telehealth: Payer: Self-pay | Admitting: Nurse Practitioner

## 2022-07-09 NOTE — Telephone Encounter (Signed)
Kristine Mcconnell from [place] my meds called in about PA for Wegovy 0.25. He says it show patient not found in cover my meds system and he is asking did we need more info. Please call back. Key # is  [id number].

## 2022-07-10 NOTE — Telephone Encounter (Signed)
Spoke to [employer] my med name had been switched, Went over all the patient demographic and insurance and she will resend the information to me to send back

## 2022-08-12 NOTE — Progress Notes (Unsigned)
   There were no vitals taken for this visit.   Subjective:    Patient ID: Kristine Mcconnell, female    DOB: [birth year], 56 y.o.   MRN: [id number]  HPI: Kristine Mcconnell is a 56 y.o. female  No chief complaint on file.  Hypertension: Her blood pressure today is ***.  She is currently taking amlodipine 5 mg daily.  She denies any shortness of breath, headaches, blurred vision or chest pain.  Hypothyroidism: Patient is currently taking levothyroxine 50 mcg daily.  Her last TSH was 4.450 on 12/03/2021.  Patient denies any palpitations, weight loss or gain, or constipation.  Obesity: Patient's current weight is ***BMI of ***.   Relevant past medical, surgical, family and social history reviewed and updated as indicated. Interim medical history since our last visit reviewed. Allergies and medications reviewed and updated.  Review of Systems  Constitutional: Negative for fever or weight change.  Respiratory: Negative for cough and shortness of breath.   Cardiovascular: Negative for chest pain or palpitations.  Gastrointestinal: Negative for abdominal pain, no bowel changes.  Musculoskeletal: Negative for gait problem or joint swelling.  Skin: Negative for rash.  Neurological: Negative for dizziness or headache.  No other specific complaints in a complete review of systems (except as listed in HPI above).      Objective:    There were no vitals taken for this visit.  Wt Readings from Last 3 Encounters:  02/13/22 233 lb 4.8 oz (105.8 kg)  12/25/21 234 lb 1.6 oz (106.2 kg)  11/28/21 237 lb 6.4 oz (107.7 kg)    Physical Exam  Constitutional: Patient appears well-developed and well-nourished. Obese  No distress.  HEENT: head atraumatic, normocephalic, pupils equal and reactive to light,  neck supple Cardiovascular: Normal rate, regular rhythm and normal heart sounds.  No murmur heard. No BLE edema. Pulmonary/Chest: Effort normal and breath sounds normal. No respiratory distress. Abdominal:  Soft.  There is no tenderness. Psychiatric: Patient has a normal mood and affect. behavior is normal. Judgment and thought content normal.  Results for orders placed or performed in visit on 12/04/21  Thyroid Panel With TSH  Result Value Ref Range   TSH 2.120 0.450 - 4.500 uIU/mL   T4, Total 9.7 4.5 - 12.0 ug/dL   T3 Uptake Ratio 22 (L) 24 - 39 %   Free Thyroxine Index 2.1 1.2 - 4.9  Thyroid peroxidase antibody  Result Value Ref Range   Thyroperoxidase Ab SerPl-aCnc <9 0 - 34 IU/mL      Assessment & Plan:   1. Essential hypertension -continue current treatment plan  2. Hypothyroidism, unspecified type -will await test results and adjust medication as appropriate  Follow up plan:  Follow up dependent on thyroid panel results.

## 2022-08-13 ENCOUNTER — Other Ambulatory Visit: Payer: Self-pay

## 2022-08-13 ENCOUNTER — Encounter: Payer: Self-pay | Admitting: Nurse Practitioner

## 2022-08-13 ENCOUNTER — Ambulatory Visit: Payer: Managed Care, Other (non HMO) | Admitting: Nurse Practitioner

## 2022-08-13 VITALS — BP 128/76 | HR 74 | Temp 98.5°F | Resp 16 | Ht 65.0 in | Wt 233.6 lb

## 2022-08-13 DIAGNOSIS — Z131 Encounter for screening for diabetes mellitus: Secondary | ICD-10-CM | POA: Diagnosis not present

## 2022-08-13 DIAGNOSIS — I10 Essential (primary) hypertension: Secondary | ICD-10-CM | POA: Diagnosis not present

## 2022-08-13 DIAGNOSIS — D509 Iron deficiency anemia, unspecified: Secondary | ICD-10-CM

## 2022-08-13 DIAGNOSIS — E538 Deficiency of other specified B group vitamins: Secondary | ICD-10-CM

## 2022-08-13 DIAGNOSIS — E039 Hypothyroidism, unspecified: Secondary | ICD-10-CM | POA: Diagnosis not present

## 2022-08-13 DIAGNOSIS — Z6838 Body mass index (BMI) 38.0-38.9, adult: Secondary | ICD-10-CM

## 2022-08-13 DIAGNOSIS — E559 Vitamin D deficiency, unspecified: Secondary | ICD-10-CM

## 2022-08-13 DIAGNOSIS — Z13220 Encounter for screening for lipoid disorders: Secondary | ICD-10-CM

## 2022-08-13 NOTE — Assessment & Plan Note (Signed)
Previously on vitamin d supplements, would like levels checked

## 2022-08-13 NOTE — Assessment & Plan Note (Signed)
Continue working on lifestyle modification 

## 2022-08-13 NOTE — Assessment & Plan Note (Signed)
Continue with levothyroxine 50 mcg daily.

## 2022-08-13 NOTE — Assessment & Plan Note (Signed)
Patient reports she was getting b12 injections a few years ago.  Will check labs.

## 2022-08-13 NOTE — Assessment & Plan Note (Signed)
Continue with amlodipine 5 mg daily.

## 2022-08-14 LAB — COMPREHENSIVE METABOLIC PANEL
ALT: 14 IU/L (ref 0–32)
AST: 14 IU/L (ref 0–40)
Albumin/Globulin Ratio: 1.8 (ref 1.2–2.2)
Albumin: 4.2 g/dL (ref 3.8–4.9)
Alkaline Phosphatase: 117 IU/L (ref 44–121)
BUN/Creatinine Ratio: 16 (ref 9–23)
BUN: 11 mg/dL (ref 6–24)
Bilirubin Total: 0.5 mg/dL (ref 0.0–1.2)
CO2: 24 mmol/L (ref 20–29)
Calcium: 9.5 mg/dL (ref 8.7–10.2)
Chloride: 106 mmol/L (ref 96–106)
Creatinine, Ser: 0.69 mg/dL (ref 0.57–1.00)
Globulin, Total: 2.4 g/dL (ref 1.5–4.5)
Glucose: 95 mg/dL (ref 70–99)
Potassium: 4 mmol/L (ref 3.5–5.2)
Sodium: 144 mmol/L (ref 134–144)
Total Protein: 6.6 g/dL (ref 6.0–8.5)
eGFR: 103 mL/min/{1.73_m2} (ref >59)

## 2022-08-14 LAB — CBC WITH DIFFERENTIAL/PLATELET
Basophils Absolute: 0 10*3/uL (ref 0.0–0.2)
Basos: 1 %
EOS (ABSOLUTE): 0.2 10*3/uL (ref 0.0–0.4)
Eos: 3 %
Hematocrit: 36.6 % (ref 34.0–46.6)
Hemoglobin: 11.5 g/dL (ref 11.1–15.9)
Immature Grans (Abs): 0 10*3/uL (ref 0.0–0.1)
Immature Granulocytes: 0 %
Lymphocytes Absolute: 1.8 10*3/uL (ref 0.7–3.1)
Lymphs: 34 %
MCH: 25.6 pg — ABNORMAL LOW (ref 26.6–33.0)
MCHC: 31.4 g/dL — ABNORMAL LOW (ref 31.5–35.7)
MCV: 82 fL (ref 79–97)
Monocytes Absolute: 0.5 10*3/uL (ref 0.1–0.9)
Monocytes: 9 %
Neutrophils Absolute: 2.9 10*3/uL (ref 1.4–7.0)
Neutrophils: 53 %
Platelets: 315 10*3/uL (ref 150–450)
RBC: 4.49 x10E6/uL (ref 3.77–5.28)
RDW: 14.5 % (ref 11.7–15.4)
WBC: 5.3 10*3/uL (ref 3.4–10.8)

## 2022-08-14 LAB — IRON,TIBC AND FERRITIN PANEL
Ferritin: 64 ng/mL (ref 15–150)
Iron Saturation: 20 % (ref 15–55)
Iron: 58 ug/dL (ref 27–159)
Total Iron Binding Capacity: 296 ug/dL (ref 250–450)
UIBC: 238 ug/dL (ref 131–425)

## 2022-08-14 LAB — LIPID PANEL
Chol/HDL Ratio: 3.6 ratio (ref 0.0–4.4)
Cholesterol, Total: 178 mg/dL (ref 100–199)
HDL: 50 mg/dL (ref >39)
LDL Chol Calc (NIH): 104 mg/dL — ABNORMAL HIGH (ref 0–99)
Triglycerides: 133 mg/dL (ref 0–149)
VLDL Cholesterol Cal: 24 mg/dL (ref 5–40)

## 2022-08-14 LAB — VITAMIN B12: Vitamin B-12: 670 pg/mL (ref 232–1245)

## 2022-08-14 LAB — HEMOGLOBIN A1C
Est. average glucose Bld gHb Est-mCnc: 137 mg/dL
Hgb A1c MFr Bld: 6.4 % — ABNORMAL HIGH (ref 4.8–5.6)

## 2022-08-14 LAB — VITAMIN D 25 HYDROXY (VIT D DEFICIENCY, FRACTURES): Vit D, 25-Hydroxy: 25 ng/mL — ABNORMAL LOW (ref 30.0–100.0)

## 2022-08-14 LAB — TSH: TSH: 2.2 u[IU]/mL (ref 0.450–4.500)

## 2022-09-04 LAB — HM PAP SMEAR: HM Pap smear: NORMAL

## 2022-09-06 ENCOUNTER — Other Ambulatory Visit: Payer: Self-pay | Admitting: Obstetrics and Gynecology

## 2022-09-06 DIAGNOSIS — Z1231 Encounter for screening mammogram for malignant neoplasm of breast: Secondary | ICD-10-CM

## 2022-10-18 ENCOUNTER — Other Ambulatory Visit: Payer: Self-pay | Admitting: Nurse Practitioner

## 2022-10-18 NOTE — Telephone Encounter (Signed)
Requested Prescriptions  Pending Prescriptions Disp Refills   amLODipine (NORVASC) 5 MG tablet [Pharmacy Med Name: AMLODIPINE BESYLATE '5MG'$  TABLETS] 90 tablet 1    Sig: TAKE 1 TABLET(5 MG) BY MOUTH DAILY     Cardiovascular: Calcium Channel Blockers 2 Passed - 10/18/2022  3:16 PM      Passed - Last BP in normal range    BP Readings from Last 1 Encounters:  08/13/22 128/76         Passed - Last Heart Rate in normal range    Pulse Readings from Last 1 Encounters:  08/13/22 74         Passed - Valid encounter within last 6 months    Recent Outpatient Visits           2 months ago Hypothyroidism, unspecified type   [facility] Bo Merino, FNP   8 months ago Class 2 severe obesity due to excess calories with serious comorbidity and body mass index (BMI) of 38.0 to 38.9 in adult [facility])   [facility] Bo Merino, FNP   9 months ago Essential hypertension   [facility] Bo Merino, FNP   10 months ago Essential hypertension   [place], DO   1 year ago [facility] Bo Merino, FNP       Future Appointments             In 4 months Reece Packer, Myna Hidalgo, [facility], [facility]

## 2022-11-01 ENCOUNTER — Ambulatory Visit
Admission: RE | Admit: 2022-11-01 | Discharge: 2022-11-01 | Disposition: A | Discharge status: Home / Self Care | Payer: Managed Care, Other (non HMO) | Source: Ambulatory Visit | Attending: Obstetrics and Gynecology | Admitting: Obstetrics and Gynecology

## 2022-11-01 DIAGNOSIS — Z1231 Encounter for screening mammogram for malignant neoplasm of breast: Secondary | ICD-10-CM | POA: Diagnosis not present

## 2023-01-07 ENCOUNTER — Encounter: Payer: Self-pay | Admitting: Family Medicine

## 2023-01-07 ENCOUNTER — Ambulatory Visit: Payer: Managed Care, Other (non HMO) | Admitting: Family Medicine

## 2023-01-07 VITALS — BP 134/80 | HR 79 | Temp 98.1°F | Resp 16 | Ht 65.0 in | Wt 227.3 lb

## 2023-01-07 DIAGNOSIS — S81811A Laceration without foreign body, right lower leg, initial encounter: Secondary | ICD-10-CM | POA: Diagnosis not present

## 2023-01-07 DIAGNOSIS — Z5189 Encounter for other specified aftercare: Secondary | ICD-10-CM | POA: Diagnosis not present

## 2023-01-07 NOTE — Patient Instructions (Signed)
Sutured Wound Care Sutures are stitches that can be used to close wounds. Some stitches break down as they heal (absorbable). Other stitches need to be taken out by your doctor (nonabsorbable). Taking good care of your wound can help to prevent pain and infection. It can also help your wound heal more quickly. Follow instructions from your doctor about how to care for your sutured wound. Supplies needed: Soap and water. A clean, dry towel. Solution to clean your wound, if needed. A clean gauze or bandage (dressing), if needed. Antibiotic ointment, if told by your doctor. How to care for your sutured wound  Keep the wound fully dry for the first 24 hours or as long as told by your doctor. After 24-48 hours, you may shower or bathe as told by your doctor. Do not soak the wound or put the wound under water until the stitches have been taken out. After the first 24 hours, clean the wound once a day, or as often as your doctor tells you to. Take these steps: Wash and rinse the wound as told by your health care provider. Pat the wound dry with a clean towel. Do not rub the wound. After cleaning the wound, put a thin layer of antibiotic ointment on the wound as told by your doctor. This will help: Prevent infection. Keep the bandage from sticking to the wound. Follow instructions from your doctor about how to change your bandage. Make sure you: Wash your hands with soap and water for at least 20 seconds. If you cannot use soap and water, use hand sanitizer. Change your bandage at least once a day, or as often as told by your doctor. If your dressing gets wet or dirty, change it. Leavestitches in place for at least 2 weeks. If you have skin glue over your stitches, this should also stay in place for at least 2 weeks. Leave tape strips alone (if you have them) unless you are told to take them off. You may trim the edges of the tape strips if they curl up. Check your wound every day for signs of  infection. Watch for: Redness, swelling, or pain. Fluid or blood. New warmth, a rash, or hardness at the wound site. Pus or a bad smell. Have the stitches taken out as told by your doctor. Follow these instructions at home: Medicines Take or apply over-the-counter and prescription medicines only as told by your doctor. If you were prescribed an antibiotic medicine or ointment, take or apply it as told by your doctor. Do not stop using the antibiotic even if you start to feel better. General instructions Cover your wound with clothes or put sunscreen on when you are outside. Use a sunscreen of at least 30 SPF. Do not scratch or pick at your wound. Avoid stretching your wound. Raise the injured area above the level of your heart while you are sitting or lying down, if possible. Eat a diet that includes protein, vitamin A, and vitamin C. Doing this will help your wound heal. Drink enough fluid to keep your pee (urine) pale yellow. Keep all follow-up visits. Contact a doctor if: You were given a tetanus shot and you have any of the following at the site where the needle went in: Swelling. Very bad pain. Redness. Bleeding. Your wound breaks open. You see something coming out of your wound, such as wood or glass. You have any of these signs of infection in or around your wound: Redness, swelling, or pain. Fluid or blood.   Warmth. A new rash. Your wound feels hard. You have a fever. The skin near your wound changes color. You have pain that does not get better with medicine. You get numbness around the wound. Get help right away if: You have very bad swelling or more pain around your wound. You have pus or a bad smell coming from your wound. You have painful lumps near your wound or anywhere on your body. You have a red streak going away from your wound. The wound is on your hand or foot, and: Your fingers or toes look pale or blue. You cannot move a finger or toe as you used to  do. You have numbness that spreads down your hand, foot, fingers, or toes. Summary Sutures are stitches that are used to close wounds. Taking good care of your wound can help to prevent pain and infection. Keep the wound fully dry for the first 24 hours or for as long as told by your doctor. After 24-48 hours, you may shower or bathe as told by your doctor. This information is not intended to replace advice given to you by your health care provider. Make sure you discuss any questions you have with your health care provider. Document Revised: 01/15/2021 Document Reviewed: 01/15/2021 Elsevier Patient Education  2023 [employer]  

## 2023-01-08 NOTE — Progress Notes (Signed)
Patient ID: Kristine Mcconnell, female    DOB: 1965-10-31, 57 y.o.   MRN: [id number]  PCP: Berniece Salines, FNP  Chief Complaint  Patient presents with   Suture / Staple Removal    Not removing today, just wanted to make sure her stiches are looking good    Subjective:   Kristine Mcconnell is a 57 y.o. female, presents to clinic with CC of the following:  Suture / Staple Removal    Patient sustained a laceration over the weekend and got sutures placed at a urgent care she is here for follow-up today The wound and sutures are intact it is located to her right outer knee she has avoided doing any strenuous exercise There are some mild redness around all of the cuts and wound edges there has been no swelling severe pain or purulent drainage Sutures placed 01/04/2023   Patient Active Problem List   Diagnosis Date Noted   B12 deficiency 08/13/2022   Hypothyroidism 02/13/2022   Essential hypertension 03/29/2020   Class 2 severe obesity due to excess calories with serious comorbidity and body mass index (BMI) of 38.0 to 38.9 in adult 03/29/2020   Elevated TSH 08/20/2019   Vitamin D deficiency 08/20/2019   Sebaceous cyst 03/14/2019   Absolute anemia 06/04/2018   Light headedness 10/26/2015   Right ankle pain 10/26/2015      Current Outpatient Medications:    amLODipine (NORVASC) 5 MG tablet, TAKE 1 TABLET(5 MG) BY MOUTH DAILY, Disp: 90 tablet, Rfl: 1   levothyroxine (SYNTHROID) 50 MCG tablet, TAKE 1 TABLET BY MOUTH EVERY DAY (MAIL ORDER REQ OR [facility]), Disp: 90 tablet, Rfl: 2   Allergies  Allergen Reactions   Other     BAND-AIDS IRRITATE SKIN-PT PREFERS TAPE     Social History   Tobacco Use   Smoking status: Never   Smokeless tobacco: Never  Vaping Use   Vaping Use: Never used  Substance Use Topics   Alcohol use: Yes    Alcohol/week: 1.0 standard drink of alcohol    Types: 1 Glasses of wine per week    Comment: OCC   Drug use: No      Chart Review Today: I  personally reviewed active problem list, medication list, allergies, family history, social history, health maintenance, notes from last encounter, lab results, imaging with the patient/caregiver today.   Review of Systems  Constitutional: Negative.   HENT: Negative.    Eyes: Negative.   Respiratory: Negative.    Cardiovascular: Negative.   Gastrointestinal: Negative.   Endocrine: Negative.   Genitourinary: Negative.   Musculoskeletal: Negative.   Skin: Negative.   Allergic/Immunologic: Negative.   Neurological: Negative.   Hematological: Negative.   Psychiatric/Behavioral: Negative.    All other systems reviewed and are negative.      Objective:   Vitals:   01/07/23 0824  BP: 134/80  Pulse: 79  Resp: 16  Temp: 98.1 F (36.7 C)  TempSrc: Oral  SpO2: 98%  Weight: 227 lb 4.8 oz (103.1 kg)  Height:  (1.651 m)    Body mass index is 37.82 kg/m.  Physical Exam Vitals and nursing note reviewed.  Constitutional:      Appearance: She is well-developed.  HENT:     Head: Normocephalic and atraumatic.     Nose: Nose normal.  Eyes:     General:        Right eye: No discharge.        Left eye: No discharge.  Conjunctiva/sclera: Conjunctivae normal.  Neck:     Trachea: No tracheal deviation.  Cardiovascular:     Rate and Rhythm: Normal rate and regular rhythm.  Pulmonary:     Effort: Pulmonary effort is normal. No respiratory distress.     Breath sounds: No stridor.  Musculoskeletal:        General: Normal range of motion.  Skin:    General: Skin is warm and dry.     Findings: No rash.     Comments: Laceration to right outer knee area, sutures in place and intact, some mild erythema w/o edema, no drainage or dehiscence Some superficial scratches above have similar local erythema and around laceration she is developing some bruising, mild tenderness to palpation No induration  Neurological:     Mental Status: She is alert.     Motor: No abnormal muscle  tone.     Coordination: Coordination normal.  Psychiatric:        Behavior: Behavior normal.      Results for orders placed or performed in visit on 08/13/22  CBC with Differential/Platelet  Result Value Ref Range   WBC 5.3 3.4 - 10.8 x10E3/uL   RBC 4.49 3.77 - 5.28 x10E6/uL   Hemoglobin 11.5 11.1 - 15.9 g/dL   Hematocrit 16.1 09.6 - 46.6 %   MCV 82 79 - 97 fL   MCH 25.6 (L) 26.6 - 33.0 pg   MCHC 31.4 (L) 31.5 - 35.7 g/dL   RDW 04.5 40.9 - 81.1 %   Platelets 315 150 - 450 x10E3/uL   Neutrophils 53 Not Estab. %   Lymphs 34 Not Estab. %   Monocytes 9 Not Estab. %   Eos 3 Not Estab. %   Basos 1 Not Estab. %   Neutrophils Absolute 2.9 1.4 - 7.0 x10E3/uL   Lymphocytes Absolute 1.8 0.7 - 3.1 x10E3/uL   Monocytes Absolute 0.5 0.1 - 0.9 x10E3/uL   EOS (ABSOLUTE) 0.2 0.0 - 0.4 x10E3/uL   Basophils Absolute 0.0 0.0 - 0.2 x10E3/uL   Immature Granulocytes 0 Not Estab. %   Immature Grans (Abs) 0.0 0.0 - 0.1 x10E3/uL  Lipid panel  Result Value Ref Range   Cholesterol, Total 178 100 - 199 mg/dL   Triglycerides 914 0 - 149 mg/dL   HDL 50 >78 mg/dL   VLDL Cholesterol Cal 24 5 - 40 mg/dL   LDL Chol Calc (NIH) 295 (H) 0 - 99 mg/dL   Chol/HDL Ratio 3.6 0.0 - 4.4 ratio  TSH  Result Value Ref Range   TSH 2.200 0.450 - 4.500 uIU/mL  Hemoglobin A1c  Result Value Ref Range   Hgb A1c MFr Bld 6.4 (H) 4.8 - 5.6 %   Est. average glucose Bld gHb Est-mCnc 137 mg/dL  Vitamin A21  Result Value Ref Range   Vitamin B-12 670 232 - 1,245 pg/mL  VITAMIN D 25 Hydroxy (Vit-D Deficiency, Fractures)  Result Value Ref Range   Vit D, 25-Hydroxy 25.0 (L) 30.0 - 100.0 ng/mL  Comprehensive metabolic panel  Result Value Ref Range   Glucose 95 70 - 99 mg/dL   BUN 11 6 - 24 mg/dL   Creatinine, Ser 3.08 0.57 - 1.00 mg/dL   eGFR 657 >84 ON/GEX/5.28   BUN/Creatinine Ratio 16 9 - 23   Sodium 144 134 - 144 mmol/L   Potassium 4.0 3.5 - 5.2 mmol/L   Chloride 106 96 - 106 mmol/L   CO2 24 20 - 29 mmol/L    Calcium 9.5 8.7 - 10.2  mg/dL   Total Protein 6.6 6.0 - 8.5 g/dL   Albumin 4.2 3.8 - 4.9 g/dL   Globulin, Total 2.4 1.5 - 4.5 g/dL   Albumin/Globulin Ratio 1.8 1.2 - 2.2   Bilirubin Total 0.5 0.0 - 1.2 mg/dL   Alkaline Phosphatase 117 44 - 121 IU/L   AST 14 0 - 40 IU/L   ALT 14 0 - 32 IU/L  Iron, TIBC and Ferritin Panel  Result Value Ref Range   Total Iron Binding Capacity 296 250 - 450 ug/dL   UIBC 161 096 - 045 ug/dL   Iron 58 27 - 409 ug/dL   Iron Saturation 20 15 - 55 %   Ferritin 64 15 - 150 ng/mL       Assessment & Plan:     ICD-10-CM   1. Leg laceration, right, initial encounter  S81.811A     2. Visit for wound check  Z51.89     Laceration 3 days ago repaired at urgent care she states that she banged her knee into scissors which cut her 9 sutures visible and intact - simple interrupted She does have some erythema and bruising to that area but it does not appear to be cellulitis there is no induration or severe tenderness no purulent discharge Reviewed wound care patient should wash the area gently without scrubbing, pat dry and apply a layer of Vaseline and cover with gauze or bandage She can allow to air dry at night if in a clean environment Since the laceration is in a fairly mobile area with a lot of movement I would leave the sutures in for 7 to 10 days Encouraged her to return to clinic around 4/22 for suture removal         Danelle Berry, PA-C 01/08/23 4:02 PM

## 2023-01-10 NOTE — Progress Notes (Unsigned)
There were no vitals taken for this visit.   Subjective:    Patient ID: Kristine Mcconnell, female    DOB: 23-Feb-1966, 57 y.o.   MRN: [id number]  HPI: Kristine Mcconnell is a 57 y.o. female  No chief complaint on file.  Suture removal: patient had laceration repair done on 01/04/2023. She is here for suture removal.   Relevant past medical, surgical, family and social history reviewed and updated as indicated. Interim medical history since our last visit reviewed. Allergies and medications reviewed and updated.  Review of Systems  Constitutional: Negative for fever or weight change.  Respiratory: Negative for cough and shortness of breath.   Cardiovascular: Negative for chest pain or palpitations.  Gastrointestinal: Negative for abdominal pain, no bowel changes.  Musculoskeletal: Negative for gait problem or joint swelling.  Skin: Negative for rash.  Neurological: Negative for dizziness or headache.  No other specific complaints in a complete review of systems (except as listed in HPI above).      Objective:    There were no vitals taken for this visit.  Wt Readings from Last 3 Encounters:  01/07/23 227 lb 4.8 oz (103.1 kg)  08/13/22 233 lb 9.6 oz (106 kg)  02/13/22 233 lb 4.8 oz (105.8 kg)    Physical Exam  Constitutional: Patient appears well-developed and well-nourished. Obese *** No distress.  HEENT: head atraumatic, normocephalic, pupils equal and reactive to light, ears ***, neck supple, throat within normal limits Cardiovascular: Normal rate, regular rhythm and normal heart sounds.  No murmur heard. No BLE edema. Pulmonary/Chest: Effort normal and breath sounds normal. No respiratory distress. Abdominal: Soft.  There is no tenderness. Psychiatric: Patient has a normal mood and affect. behavior is normal. Judgment and thought content normal.  Results for orders placed or performed in visit on 08/13/22  CBC with Differential/Platelet  Result Value Ref Range   WBC 5.3 3.4 -  10.8 x10E3/uL   RBC 4.49 3.77 - 5.28 x10E6/uL   Hemoglobin 11.5 11.1 - 15.9 g/dL   Hematocrit 78.2 95.6 - 46.6 %   MCV 82 79 - 97 fL   MCH 25.6 (L) 26.6 - 33.0 pg   MCHC 31.4 (L) 31.5 - 35.7 g/dL   RDW 21.3 08.6 - 57.8 %   Platelets 315 150 - 450 x10E3/uL   Neutrophils 53 Not Estab. %   Lymphs 34 Not Estab. %   Monocytes 9 Not Estab. %   Eos 3 Not Estab. %   Basos 1 Not Estab. %   Neutrophils Absolute 2.9 1.4 - 7.0 x10E3/uL   Lymphocytes Absolute 1.8 0.7 - 3.1 x10E3/uL   Monocytes Absolute 0.5 0.1 - 0.9 x10E3/uL   EOS (ABSOLUTE) 0.2 0.0 - 0.4 x10E3/uL   Basophils Absolute 0.0 0.0 - 0.2 x10E3/uL   Immature Granulocytes 0 Not Estab. %   Immature Grans (Abs) 0.0 0.0 - 0.1 x10E3/uL  Lipid panel  Result Value Ref Range   Cholesterol, Total 178 100 - 199 mg/dL   Triglycerides 469 0 - 149 mg/dL   HDL 50 >62 mg/dL   VLDL Cholesterol Cal 24 5 - 40 mg/dL   LDL Chol Calc (NIH) 952 (H) 0 - 99 mg/dL   Chol/HDL Ratio 3.6 0.0 - 4.4 ratio  TSH  Result Value Ref Range   TSH 2.200 0.450 - 4.500 uIU/mL  Hemoglobin A1c  Result Value Ref Range   Hgb A1c MFr Bld 6.4 (H) 4.8 - 5.6 %   Est. average glucose Bld gHb Est-mCnc  137 mg/dL  Vitamin Z61  Result Value Ref Range   Vitamin B-12 670 232 - 1,245 pg/mL  VITAMIN D 25 Hydroxy (Vit-D Deficiency, Fractures)  Result Value Ref Range   Vit D, 25-Hydroxy 25.0 (L) 30.0 - 100.0 ng/mL  Comprehensive metabolic panel  Result Value Ref Range   Glucose 95 70 - 99 mg/dL   BUN 11 6 - 24 mg/dL   Creatinine, Ser 0.96 0.57 - 1.00 mg/dL   eGFR 045 >40 JW/JXB/1.47   BUN/Creatinine Ratio 16 9 - 23   Sodium 144 134 - 144 mmol/L   Potassium 4.0 3.5 - 5.2 mmol/L   Chloride 106 96 - 106 mmol/L   CO2 24 20 - 29 mmol/L   Calcium 9.5 8.7 - 10.2 mg/dL   Total Protein 6.6 6.0 - 8.5 g/dL   Albumin 4.2 3.8 - 4.9 g/dL   Globulin, Total 2.4 1.5 - 4.5 g/dL   Albumin/Globulin Ratio 1.8 1.2 - 2.2   Bilirubin Total 0.5 0.0 - 1.2 mg/dL   Alkaline Phosphatase 117 44 -  121 IU/L   AST 14 0 - 40 IU/L   ALT 14 0 - 32 IU/L  Iron, TIBC and Ferritin Panel  Result Value Ref Range   Total Iron Binding Capacity 296 250 - 450 ug/dL   UIBC 829 562 - 130 ug/dL   Iron 58 27 - 865 ug/dL   Iron Saturation 20 15 - 55 %   Ferritin 64 15 - 150 ng/mL      Assessment & Plan:   Problem List Items Addressed This Visit   None    Follow up plan: No follow-ups on file.

## 2023-01-13 ENCOUNTER — Ambulatory Visit: Payer: Managed Care, Other (non HMO) | Admitting: Nurse Practitioner

## 2023-01-13 ENCOUNTER — Encounter: Payer: Self-pay | Admitting: Nurse Practitioner

## 2023-01-13 VITALS — BP 132/72 | HR 71 | Resp 16 | Ht 65.0 in | Wt 227.0 lb

## 2023-01-13 DIAGNOSIS — Z4802 Encounter for removal of sutures: Secondary | ICD-10-CM | POA: Diagnosis not present

## 2023-02-20 NOTE — Progress Notes (Signed)
BP 126/74   Pulse 67   Temp 97.9 F (36.6 C) (Oral)   Resp 16   Ht 5\' 5"  (1.651 m)   Wt 223 lb 14.4 oz (101.6 kg)   SpO2 98%   BMI 37.26 kg/m    Subjective:    Patient ID: Kristine Mcconnell, female    DOB: 1966-08-11, 57 y.o.   MRN: [id number]  HPI: Kristine Mcconnell is a 57 y.o. female  Chief Complaint  Patient presents with   Hypothyroidism   Hypertension    6 month follow up   Hypertension: her blood pressure today is 126/74.  She is currently taking amlodipine 5 mg daily. She denies any headaches, blurred vision, chest pain or shortness of breath.   Hypothyroidism: she currently takes levothyroxine 50 mcg daily.  She denies any constipation, fatigue, or palpitations   Obesity: her weight today is 223 lbs with a BMI of 37.26.  We have tried previously to get her approved for wegovy but insurance would not cover.  She continues to work on lifestyle modification. Discussed [facility]. Co morbidities include prediabetes, hyperlipidemia, htn and hypothyroidism.   Prediabetes: her last A1C was 6.4 on 08/14/2023. She denies any polyuria, polydipsia or polyphagia.  She is working on lifestyle modification.   Hyperlipidemia: her last LDL was 104 on 08/14/2023. She is not currently on cholesterol lowering medication.  The 10-year ASCVD risk score (Arnett DK, et al., 2019) is: 4.3%   Values used to calculate the score:     Age: 71 years     Sex: Female     Is Non-Hispanic African American: Yes     Diabetic: No     Tobacco smoker: No     Systolic Blood Pressure: 126 mmHg     Is BP treated: Yes     HDL Cholesterol: 46 mg/dL     Total Cholesterol: 161 mg/dL   Relevant past medical, surgical, family and social history reviewed and updated as indicated. Interim medical history since our last visit reviewed. Allergies and medications reviewed and updated.  Review of Systems  Constitutional: Negative for fever or weight change.  Respiratory: Negative for cough  and shortness of breath.   Cardiovascular: Negative for chest pain or palpitations.  Gastrointestinal: Negative for abdominal pain, no bowel changes.  Musculoskeletal: Negative for gait problem or joint swelling.  Skin: Negative for rash.  Neurological: Negative for dizziness or headache.  No other specific complaints in a complete review of systems (except as listed in HPI above).      Objective:    BP 126/74   Pulse 67   Temp 97.9 F (36.6 C) (Oral)   Resp 16   Ht 5\' 5"  (1.651 m)   Wt 223 lb 14.4 oz (101.6 kg)   SpO2 98%   BMI 37.26 kg/m   Wt Readings from Last 3 Encounters:  02/21/23 223 lb 14.4 oz (101.6 kg)  01/13/23 227 lb (103 kg)  01/07/23 227 lb 4.8 oz (103.1 kg)    Physical Exam  Constitutional: Patient appears well-developed and well-nourished. Obese  No distress.  HEENT: head atraumatic, normocephalic, pupils equal and reactive to light,  neck supple Cardiovascular: Normal rate, regular rhythm and normal heart sounds.  No murmur heard. No BLE edema. Pulmonary/Chest: Effort normal and breath sounds normal. No respiratory distress. Abdominal: Soft.  There is no tenderness. Psychiatric: Patient has a normal mood and affect. behavior is normal. Judgment and thought content normal.  Results for  orders placed or performed in visit on 08/13/22  CBC with Differential/Platelet  Result Value Ref Range   WBC 5.3 3.4 - 10.8 x10E3/uL   RBC 4.49 3.77 - 5.28 x10E6/uL   Hemoglobin 11.5 11.1 - 15.9 g/dL   Hematocrit 40.9 81.1 - 46.6 %   MCV 82 79 - 97 fL   MCH 25.6 (L) 26.6 - 33.0 pg   MCHC 31.4 (L) 31.5 - 35.7 g/dL   RDW 91.4 78.2 - 95.6 %   Platelets 315 150 - 450 x10E3/uL   Neutrophils 53 Not Estab. %   Lymphs 34 Not Estab. %   Monocytes 9 Not Estab. %   Eos 3 Not Estab. %   Basos 1 Not Estab. %   Neutrophils Absolute 2.9 1.4 - 7.0 x10E3/uL   Lymphocytes Absolute 1.8 0.7 - 3.1 x10E3/uL   Monocytes Absolute 0.5 0.1 - 0.9 x10E3/uL   EOS (ABSOLUTE) 0.2 0.0 - 0.4  x10E3/uL   Basophils Absolute 0.0 0.0 - 0.2 x10E3/uL   Immature Granulocytes 0 Not Estab. %   Immature Grans (Abs) 0.0 0.0 - 0.1 x10E3/uL  Lipid panel  Result Value Ref Range   Cholesterol, Total 178 100 - 199 mg/dL   Triglycerides 213 0 - 149 mg/dL   HDL 50 >08 mg/dL   VLDL Cholesterol Cal 24 5 - 40 mg/dL   LDL Chol Calc (NIH) 657 (H) 0 - 99 mg/dL   Chol/HDL Ratio 3.6 0.0 - 4.4 ratio  TSH  Result Value Ref Range   TSH 2.200 0.450 - 4.500 uIU/mL  Hemoglobin A1c  Result Value Ref Range   Hgb A1c MFr Bld 6.4 (H) 4.8 - 5.6 %   Est. average glucose Bld gHb Est-mCnc 137 mg/dL  Vitamin Q46  Result Value Ref Range   Vitamin B-12 670 232 - 1,245 pg/mL  VITAMIN D 25 Hydroxy (Vit-D Deficiency, Fractures)  Result Value Ref Range   Vit D, 25-Hydroxy 25.0 (L) 30.0 - 100.0 ng/mL  Comprehensive metabolic panel  Result Value Ref Range   Glucose 95 70 - 99 mg/dL   BUN 11 6 - 24 mg/dL   Creatinine, Ser 9.62 0.57 - 1.00 mg/dL   eGFR 952 >84 XL/KGM/0.10   BUN/Creatinine Ratio 16 9 - 23   Sodium 144 134 - 144 mmol/L   Potassium 4.0 3.5 - 5.2 mmol/L   Chloride 106 96 - 106 mmol/L   CO2 24 20 - 29 mmol/L   Calcium 9.5 8.7 - 10.2 mg/dL   Total Protein 6.6 6.0 - 8.5 g/dL   Albumin 4.2 3.8 - 4.9 g/dL   Globulin, Total 2.4 1.5 - 4.5 g/dL   Albumin/Globulin Ratio 1.8 1.2 - 2.2   Bilirubin Total 0.5 0.0 - 1.2 mg/dL   Alkaline Phosphatase 117 44 - 121 IU/L   AST 14 0 - 40 IU/L   ALT 14 0 - 32 IU/L  Iron, TIBC and Ferritin Panel  Result Value Ref Range   Total Iron Binding Capacity 296 250 - 450 ug/dL   UIBC 272 536 - 644 ug/dL   Iron 58 27 - 034 ug/dL   Iron Saturation 20 15 - 55 %   Ferritin 64 15 - 150 ng/mL      Assessment & Plan:   Problem List Items Addressed This Visit       Cardiovascular and Mediastinum   Essential hypertension    Blood pressure at goal, continue amlodipine 5 mg daily      Relevant Medications   amLODipine (  NORVASC) 5 MG tablet   Other Relevant Orders    CBC with Differential/Platelet   Comprehensive metabolic panel     Endocrine   Hypothyroidism - Primary    Will recheck TSH, continue levothyroxine 50 mcg daily.       Relevant Orders   TSH     Other   Class 2 severe obesity due to excess calories with serious comorbidity and body mass index (BMI) of 38.0 to 38.9 in adult [facility])    Continue lifestyle modification, discussed [facility]      Hyperlipidemia    Rechecking labs, continue lifestyle modification.       Relevant Medications   amLODipine (NORVASC) 5 MG tablet   Other Relevant Orders   Lipid panel   Prediabetes    Rechecking labs, continue lifestyle modification.       Relevant Orders   Hemoglobin A1c   Comprehensive metabolic panel     Follow up plan:  6 month follow up

## 2023-02-21 ENCOUNTER — Ambulatory Visit: Payer: Managed Care, Other (non HMO) | Admitting: Nurse Practitioner

## 2023-02-21 ENCOUNTER — Other Ambulatory Visit: Payer: Self-pay

## 2023-02-21 ENCOUNTER — Encounter: Payer: Self-pay | Admitting: Nurse Practitioner

## 2023-02-21 VITALS — BP 126/74 | HR 67 | Temp 97.9°F | Resp 16 | Ht 65.0 in | Wt 223.9 lb

## 2023-02-21 DIAGNOSIS — E039 Hypothyroidism, unspecified: Secondary | ICD-10-CM

## 2023-02-21 DIAGNOSIS — R7303 Prediabetes: Secondary | ICD-10-CM | POA: Insufficient documentation

## 2023-02-21 DIAGNOSIS — E785 Hyperlipidemia, unspecified: Secondary | ICD-10-CM | POA: Insufficient documentation

## 2023-02-21 DIAGNOSIS — I10 Essential (primary) hypertension: Secondary | ICD-10-CM

## 2023-02-21 DIAGNOSIS — Z6838 Body mass index (BMI) 38.0-38.9, adult: Secondary | ICD-10-CM

## 2023-02-21 MED ORDER — AMLODIPINE BESYLATE 5 MG PO TABS: ORAL_TABLET | ORAL | 1 refills | Status: DC

## 2023-02-21 NOTE — Assessment & Plan Note (Signed)
Rechecking labs, continue lifestyle modification.  

## 2023-02-21 NOTE — Assessment & Plan Note (Signed)
Blood pressure at goal, continue amlodipine 5 mg daily

## 2023-02-21 NOTE — Assessment & Plan Note (Signed)
Will recheck TSH, continue levothyroxine 50 mcg daily.

## 2023-02-21 NOTE — Assessment & Plan Note (Signed)
Continue lifestyle modification, discussed [facility]

## 2023-02-21 NOTE — Assessment & Plan Note (Signed)
Rechecking labs, continue lifestyle modification.

## 2023-02-22 LAB — LIPID PANEL
Chol/HDL Ratio: 3.5 ratio (ref 0.0–4.4)
Cholesterol, Total: 167 mg/dL (ref 100–199)
HDL: 48 mg/dL (ref >39)
LDL Chol Calc (NIH): 107 mg/dL — ABNORMAL HIGH (ref 0–99)
Triglycerides: 59 mg/dL (ref 0–149)
VLDL Cholesterol Cal: 12 mg/dL (ref 5–40)

## 2023-02-22 LAB — COMPREHENSIVE METABOLIC PANEL
ALT: 10 IU/L (ref 0–32)
AST: 13 IU/L (ref 0–40)
Albumin/Globulin Ratio: 1.9 (ref 1.2–2.2)
Albumin: 4.3 g/dL (ref 3.8–4.9)
Alkaline Phosphatase: 120 IU/L (ref 44–121)
BUN/Creatinine Ratio: 13 (ref 9–23)
BUN: 10 mg/dL (ref 6–24)
Bilirubin Total: 0.7 mg/dL (ref 0.0–1.2)
CO2: 23 mmol/L (ref 20–29)
Calcium: 9.4 mg/dL (ref 8.7–10.2)
Chloride: 104 mmol/L (ref 96–106)
Creatinine, Ser: 0.75 mg/dL (ref 0.57–1.00)
Globulin, Total: 2.3 g/dL (ref 1.5–4.5)
Glucose: 91 mg/dL (ref 70–99)
Potassium: 4.2 mmol/L (ref 3.5–5.2)
Sodium: 140 mmol/L (ref 134–144)
Total Protein: 6.6 g/dL (ref 6.0–8.5)
eGFR: 95 mL/min/{1.73_m2} (ref >59)

## 2023-02-22 LAB — CBC WITH DIFFERENTIAL/PLATELET
Basophils Absolute: 0 10*3/uL (ref 0.0–0.2)
Basos: 1 %
EOS (ABSOLUTE): 0.2 10*3/uL (ref 0.0–0.4)
Eos: 3 %
Hematocrit: 36.5 % (ref 34.0–46.6)
Hemoglobin: 11.5 g/dL (ref 11.1–15.9)
Immature Grans (Abs): 0 10*3/uL (ref 0.0–0.1)
Immature Granulocytes: 0 %
Lymphocytes Absolute: 1.9 10*3/uL (ref 0.7–3.1)
Lymphs: 38 %
MCH: 25.4 pg — ABNORMAL LOW (ref 26.6–33.0)
MCHC: 31.5 g/dL (ref 31.5–35.7)
MCV: 81 fL (ref 79–97)
Monocytes Absolute: 0.5 10*3/uL (ref 0.1–0.9)
Monocytes: 10 %
Neutrophils Absolute: 2.4 10*3/uL (ref 1.4–7.0)
Neutrophils: 48 %
Platelets: 316 10*3/uL (ref 150–450)
RBC: 4.53 x10E6/uL (ref 3.77–5.28)
RDW: 13.8 % (ref 11.7–15.4)
WBC: 4.9 10*3/uL (ref 3.4–10.8)

## 2023-02-22 LAB — TSH: TSH: 4.41 u[IU]/mL (ref 0.450–4.500)

## 2023-02-22 LAB — HEMOGLOBIN A1C
Est. average glucose Bld gHb Est-mCnc: 134 mg/dL
Hgb A1c MFr Bld: 6.3 % — ABNORMAL HIGH (ref 4.8–5.6)

## 2023-03-11 ENCOUNTER — Ambulatory Visit: Payer: Managed Care, Other (non HMO) | Admitting: Nurse Practitioner

## 2023-12-22 ENCOUNTER — Other Ambulatory Visit: Payer: Self-pay | Admitting: Obstetrics and Gynecology

## 2023-12-22 DIAGNOSIS — Z1231 Encounter for screening mammogram for malignant neoplasm of breast: Secondary | ICD-10-CM

## 2023-12-25 ENCOUNTER — Ambulatory Visit
Admission: RE | Admit: 2023-12-25 | Discharge: 2023-12-25 | Disposition: A | Discharge status: Home / Self Care | Source: Ambulatory Visit | Attending: Obstetrics and Gynecology | Admitting: Obstetrics and Gynecology

## 2023-12-25 ENCOUNTER — Encounter: Payer: Self-pay | Admitting: Radiology

## 2023-12-25 DIAGNOSIS — Z1231 Encounter for screening mammogram for malignant neoplasm of breast: Secondary | ICD-10-CM | POA: Insufficient documentation

## 2024-02-02 ENCOUNTER — Other Ambulatory Visit: Payer: Self-pay | Admitting: Nurse Practitioner

## 2024-02-02 DIAGNOSIS — I10 Essential (primary) hypertension: Secondary | ICD-10-CM

## 2024-02-03 NOTE — Telephone Encounter (Signed)
 Courtesy refill. Requested Prescriptions  Pending Prescriptions Disp Refills   amLODipine  (NORVASC ) 5 MG tablet [Pharmacy Med Name: AMLODIPINE  BESYLATE 5MG  TABLETS] 30 tablet 0    Sig: TAKE 1 TABLET(5 MG) BY MOUTH DAILY     Cardiovascular: Calcium Channel Blockers 2 Failed - 02/03/2024  3:42 PM      Failed - Valid encounter within last 6 months    Recent Outpatient Visits   None            Passed - Last BP in normal range    BP Readings from Last 1 Encounters:  02/21/23 126/74         Passed - Last Heart Rate in normal range    Pulse Readings from Last 1 Encounters:  02/21/23 67

## 2024-04-06 ENCOUNTER — Encounter: Payer: Self-pay | Admitting: Nurse Practitioner

## 2024-04-06 ENCOUNTER — Other Ambulatory Visit (HOSPITAL_COMMUNITY): Payer: Self-pay

## 2024-04-06 ENCOUNTER — Ambulatory Visit: Admitting: Nurse Practitioner

## 2024-04-06 ENCOUNTER — Telehealth: Payer: Self-pay | Admitting: Pharmacy Technician

## 2024-04-06 VITALS — BP 124/80 | HR 81 | Temp 97.8°F | Resp 18 | Ht 65.0 in | Wt 225.0 lb

## 2024-04-06 DIAGNOSIS — E039 Hypothyroidism, unspecified: Secondary | ICD-10-CM | POA: Diagnosis not present

## 2024-04-06 DIAGNOSIS — I10 Essential (primary) hypertension: Secondary | ICD-10-CM

## 2024-04-06 DIAGNOSIS — N912 Amenorrhea, unspecified: Secondary | ICD-10-CM

## 2024-04-06 DIAGNOSIS — E66812 Obesity, class 2: Secondary | ICD-10-CM

## 2024-04-06 DIAGNOSIS — Z6838 Body mass index (BMI) 38.0-38.9, adult: Secondary | ICD-10-CM

## 2024-04-06 DIAGNOSIS — E785 Hyperlipidemia, unspecified: Secondary | ICD-10-CM | POA: Diagnosis not present

## 2024-04-06 DIAGNOSIS — R7303 Prediabetes: Secondary | ICD-10-CM

## 2024-04-06 DIAGNOSIS — E559 Vitamin D deficiency, unspecified: Secondary | ICD-10-CM

## 2024-04-06 DIAGNOSIS — E538 Deficiency of other specified B group vitamins: Secondary | ICD-10-CM

## 2024-04-06 DIAGNOSIS — Z419 Encounter for procedure for purposes other than remedying health state, unspecified: Secondary | ICD-10-CM

## 2024-04-06 MED ORDER — WEGOVY 0.25 MG/0.5ML ~~LOC~~ SOAJ
0.2500 mg | SUBCUTANEOUS | 0 refills | Status: AC
Start: 2024-04-06 — End: ?

## 2024-04-06 MED ORDER — AMLODIPINE BESYLATE 5 MG PO TABS: ORAL_TABLET | ORAL | 1 refills | Status: DC

## 2024-04-06 NOTE — Telephone Encounter (Signed)
 Pharmacy Patient Advocate Encounter   Received notification from CoverMyMeds that prior authorization for Wegovy  0.25MG /0.5ML auto-injectors is required/requested.   Insurance verification completed.   The patient is insured through [facility] .   Per test claim: PA required; PA submitted to above mentioned insurance via CoverMyMeds Key/confirmation #/EOC [id number] Status is pending

## 2024-04-06 NOTE — Progress Notes (Signed)
 BP 124/80   Pulse 81   Temp 97.8 F (36.6 C)   Resp 18   Ht 5' 5 (1.651 m)   Wt 225 lb (102.1 kg)   LMP  (LMP Unknown)   SpO2 99%   BMI 37.44 kg/m    Subjective:    Patient ID: Kristine Mcconnell, female    DOB: 04-Dec-1965, 58 y.o.   MRN: [id number]  HPI: Kristine Mcconnell is a 58 y.o. female  Chief Complaint  Patient presents with   Medical Management of Chronic Issues   Hypothyroidism    Not taking meds, wants to talk about getting cortisol level and hormones checked   Hypertension    Discussed the use of AI scribe software for clinical note transcription with the patient, who gave verbal consent to proceed.  History of Present Illness Kristine Mcconnell is a 58 year old female who presents for a routine follow-up.  She has hypertension and is on amlodipine  5 mg daily, with generally well-controlled blood pressure. She noted a previous blood pressure reading of 155 mmHg at another location.  She has a history of hypothyroidism but is not currently taking her levothyroxine  50 mcg daily. She feels fine despite not taking the medication. Her last thyroid  function test was over a year ago and was normal.  She has not had a menstrual period in over a year and is interested in having her hormone levels checked. No symptoms of menopause such as hot flashes, except for one instance of heat sensation from her chest to her face.  She has a history of obesity. She previously considered Wegovy  for weight management but is hesitant due to potential side effects. She is trying to increase her protein intake but struggles with appetite and is considering consulting a nutritionist.  She has a history of hyperlipidemia with an LDL of 107 from her last labs over a year ago. She also has a history of prediabetes with a last A1c of 6.3%.  She has a history of vitamin D  and B12 deficiencies and would like these levels checked again.   Starting weight: 225 lbs Starting BMI: 37.44 Waist Measurement :  42 inches      04/06/2024    8:36 AM 02/21/2023   10:06 AM 01/07/2023    8:24 AM  Depression screen PHQ 2/9  Decreased Interest 0 0 0  Down, Depressed, Hopeless 0 0 0  PHQ - 2 Score 0 0 0  Altered sleeping 0  0  Tired, decreased energy 0  0  Change in appetite 0  0  Feeling bad or failure about yourself  0  0  Trouble concentrating 0  0  Moving slowly or fidgety/restless 0  0  Suicidal thoughts 0  0  PHQ-9 Score 0  0  Difficult doing work/chores Not difficult at all  Not difficult at all    Relevant past medical, surgical, family and social history reviewed and updated as indicated. Interim medical history since our last visit reviewed. Allergies and medications reviewed and updated.  Review of Systems  Constitutional: Negative for fever or weight change.  Respiratory: Negative for cough and shortness of breath.   Cardiovascular: Negative for chest pain or palpitations.  Gastrointestinal: Negative for abdominal pain, no bowel changes.  Musculoskeletal: Negative for gait problem or joint swelling.  Skin: Negative for rash.  Neurological: Negative for dizziness or headache.  No other specific complaints in a complete review of systems (except as listed in HPI above).  Objective:     BP 124/80   Pulse 81   Temp 97.8 F (36.6 C)   Resp 18   Ht 5' 5 (1.651 m)   Wt 225 lb (102.1 kg)   LMP  (LMP Unknown)   SpO2 99%   BMI 37.44 kg/m    Wt Readings from Last 3 Encounters:  04/06/24 225 lb (102.1 kg)  02/21/23 223 lb 14.4 oz (101.6 kg)  01/13/23 227 lb (103 kg)    Physical Exam Physical Exam VITALS: BP- 124/80 MEASUREMENTS: Weight- 225, BMI- 37.44. GENERAL: Alert, cooperative, well developed, no acute distress HEENT: Normocephalic, normal oropharynx, moist mucous membranes CHEST: Clear to auscultation bilaterally, no wheezes, rhonchi, or crackles CARDIOVASCULAR: Normal heart rate and rhythm, S1 and S2 normal without murmurs ABDOMEN: Soft, non-tender,  non-distended, without organomegaly, normal bowel sounds EXTREMITIES: No cyanosis or edema NEUROLOGICAL: Cranial nerves grossly intact, moves all extremities without gross motor or sensory deficit   Results for orders placed or performed in visit on 02/21/23  HM PAP SMEAR   Collection Time: 09/04/22 12:00 AM  Result Value Ref Range   HM Pap smear normal           Assessment & Plan:   Problem List Items Addressed This Visit       Cardiovascular and Mediastinum   Essential hypertension - Primary   Relevant Medications   amLODipine  (NORVASC ) 5 MG tablet   Other Relevant Orders   CBC with Differential/Platelet   Comprehensive metabolic panel with GFR     Endocrine   Hypothyroidism   Relevant Orders   T3, free   T4, free   TSH     Other   Vitamin D  deficiency   Relevant Orders   VITAMIN D  25 Hydroxy (Vit-D Deficiency, Fractures)   Class 2 severe obesity due to excess calories with serious comorbidity and body mass index (BMI) of 38.0 to 38.9 in adult [facility])   Relevant Medications   WEGOVY  0.25 MG/0.5ML SOAJ   Other Relevant Orders   Referral to Nutrition and Diabetes Services   B12 deficiency   Relevant Orders   Vitamin B12   Hyperlipidemia   Relevant Medications   amLODipine  (NORVASC ) 5 MG tablet   Other Relevant Orders   Lipid panel   Prediabetes   Relevant Orders   Comprehensive metabolic panel with GFR   Hemoglobin A1c   Other Visit Diagnoses       Patient-requested procedure       Relevant Orders   Cortisol     Amenorrhea       Relevant Orders   Estrogens , total   FSH/LH   Prolactin        Assessment and Plan Assessment & Plan Obesity Obesity with a current weight of 225 pounds and BMI of 37.44. She is considering weight management options but is hesitant about medications like Wegovy  due to potential side effects such as hair loss and adverse reactions, but would still like to try and see if she can get approved. She is trying to increase  protein intake but struggles with appetite.  - Refer to a nutritionist for dietary planning and protein intake management - Discuss potential use of Wegovy  or Zepbound for weight management, pending approval - Encourage continuation of lifestyle modifications, including dietary management and regular exercise. -continue to increase physical activity, getting at least 150 min of physical activity a week.  Work on including Runner, broadcasting/film/video 2 days a week.  - continue eating at a calorie deficit  1600-1700 cal a day, eating a well balanced diet with whole foods, avoiding processed foods.     Hypertension Hypertension with amlodipine  5 mg daily. Recent blood pressure reading was 124/80 mmHg. - Continue amlodipine  5 mg daily - Refill amlodipine  prescription at [facility] on [address]  Hypothyroidism Hypothyroidism is managed with levothyroxine  50 mcg daily. However, she is not currently taking her thyroid  medication but reports feeling fine. Last TSH was normal over a year ago. - Order TSH to assess current thyroid  function  Hyperlipidemia Hyperlipidemia with a previous LDL of 107 mg/dL. Last lipid panel was over a year ago. - Order lipid panel to assess current lipid levels  Prediabetes Prediabetes with a last A1c of 6.3%. - Order hemoglobin A1c to assess current glycemic control  Vitamin D  deficiency Vitamin D  deficiency. Current status unknown as last labs were over a year ago. - Order vitamin D  level to assess current status  Vitamin B12 deficiency Vitamin B12 deficiency. Current status unknown as last labs were over a year ago. - Order vitamin B12 level to assess current status  Amenorrhea Amenorrhea for over a year, likely due to menopause. She is not experiencing significant menopausal symptoms and is not interested in hormone replacement therapy at this time. She is interested in checking hormone levels for reassurance. Discussed that starting hormone replacement after ten  years increases cardiovascular risk significantly. Non-hormonal options like Veozah are available for managing menopausal symptoms. - Order hormone panel to assess current hormone levels        Follow up plan: Return in about 3 months (around 07/07/2024) for follow up, weight check for weight loss medication.

## 2024-04-06 NOTE — Telephone Encounter (Signed)
 Pharmacy Patient Advocate Encounter  Received notification from [employer] that Prior Authorization for Wegovy  0.25MG /0.5ML auto-injectors has been APPROVED from 04/06/24 to 04/06/25. Ran test claim, Copay is $24.99. This test claim was processed through [facility]- copay amounts may vary at other pharmacies due to pharmacy/plan contracts, or as the patient moves through the different stages of their insurance plan.   PA #/Case ID/Reference #: [id number]

## 2024-04-07 ENCOUNTER — Ambulatory Visit: Payer: Self-pay | Admitting: Nurse Practitioner

## 2024-04-07 LAB — CBC WITH DIFFERENTIAL/PLATELET
Basophils Absolute: 0 x10E3/uL (ref 0.0–0.2)
Basos: 1 %
EOS (ABSOLUTE): 0.2 x10E3/uL (ref 0.0–0.4)
Eos: 4 %
Hematocrit: 38.2 % (ref 34.0–46.6)
Hemoglobin: 11.7 g/dL (ref 11.1–15.9)
Immature Grans (Abs): 0 x10E3/uL (ref 0.0–0.1)
Immature Granulocytes: 0 %
Lymphocytes Absolute: 1.8 x10E3/uL (ref 0.7–3.1)
Lymphs: 41 %
MCH: 25.8 pg — ABNORMAL LOW (ref 26.6–33.0)
MCHC: 30.6 g/dL — ABNORMAL LOW (ref 31.5–35.7)
MCV: 84 fL (ref 79–97)
Monocytes Absolute: 0.5 x10E3/uL (ref 0.1–0.9)
Monocytes: 11 %
Neutrophils Absolute: 1.9 x10E3/uL (ref 1.4–7.0)
Neutrophils: 42 %
Platelets: 312 x10E3/uL (ref 150–450)
RBC: 4.53 x10E6/uL (ref 3.77–5.28)
RDW: 13.9 % (ref 11.7–15.4)
WBC: 4.4 x10E3/uL (ref 3.4–10.8)

## 2024-04-07 LAB — TSH: TSH: 3.48 u[IU]/mL (ref 0.450–4.500)

## 2024-04-07 LAB — LIPID PANEL
Chol/HDL Ratio: 3.8 ratio (ref 0.0–4.4)
Cholesterol, Total: 154 mg/dL (ref 100–199)
HDL: 41 mg/dL (ref >39)
LDL Chol Calc (NIH): 98 mg/dL (ref 0–99)
Triglycerides: 77 mg/dL (ref 0–149)
VLDL Cholesterol Cal: 15 mg/dL (ref 5–40)

## 2024-04-07 LAB — COMPREHENSIVE METABOLIC PANEL WITH GFR
ALT: 17 IU/L (ref 0–32)
AST: 21 IU/L (ref 0–40)
Albumin: 4.2 g/dL (ref 3.8–4.9)
Alkaline Phosphatase: 112 IU/L (ref 44–121)
BUN/Creatinine Ratio: 9 (ref 9–23)
BUN: 8 mg/dL (ref 6–24)
Bilirubin Total: 0.8 mg/dL (ref 0.0–1.2)
CO2: 23 mmol/L (ref 20–29)
Calcium: 9.3 mg/dL (ref 8.7–10.2)
Chloride: 104 mmol/L (ref 96–106)
Creatinine, Ser: 0.87 mg/dL (ref 0.57–1.00)
Globulin, Total: 2.4 g/dL (ref 1.5–4.5)
Glucose: 95 mg/dL (ref 70–99)
Potassium: 4.4 mmol/L (ref 3.5–5.2)
Sodium: 141 mmol/L (ref 134–144)
Total Protein: 6.6 g/dL (ref 6.0–8.5)
eGFR: 79 mL/min/1.73 (ref >59)

## 2024-04-07 LAB — VITAMIN B12: Vitamin B-12: 738 pg/mL (ref 232–1245)

## 2024-04-07 LAB — VITAMIN D 25 HYDROXY (VIT D DEFICIENCY, FRACTURES): Vit D, 25-Hydroxy: 29.2 ng/mL — ABNORMAL LOW (ref 30.0–100.0)

## 2024-04-07 LAB — T3, FREE: T3, Free: 3.6 pg/mL (ref 2.0–4.4)

## 2024-04-07 LAB — ESTROGENS, TOTAL: Estrogen: 155 pg/mL (ref 40–244)

## 2024-04-07 LAB — FSH/LH
FSH: 36 m[IU]/mL
LH: 27.3 m[IU]/mL

## 2024-04-07 LAB — HEMOGLOBIN A1C
Est. average glucose Bld gHb Est-mCnc: 126 mg/dL
Hgb A1c MFr Bld: 6 % — ABNORMAL HIGH (ref 4.8–5.6)

## 2024-04-07 LAB — CORTISOL: Cortisol: 9.7 ug/dL (ref 6.2–19.4)

## 2024-04-07 LAB — T4, FREE: Free T4: 1.21 ng/dL (ref 0.82–1.77)

## 2024-04-07 LAB — PROLACTIN: Prolactin: 7.2 ng/mL (ref 3.6–25.2)

## 2024-07-08 ENCOUNTER — Encounter: Payer: Self-pay | Admitting: Nurse Practitioner

## 2024-07-08 ENCOUNTER — Ambulatory Visit: Admitting: Nurse Practitioner

## 2024-07-08 VITALS — BP 136/82 | HR 73 | Resp 16 | Ht 65.0 in | Wt 227.6 lb

## 2024-07-08 DIAGNOSIS — E66812 Obesity, class 2: Secondary | ICD-10-CM

## 2024-07-08 DIAGNOSIS — Z6838 Body mass index (BMI) 38.0-38.9, adult: Secondary | ICD-10-CM

## 2024-07-08 DIAGNOSIS — E785 Hyperlipidemia, unspecified: Secondary | ICD-10-CM

## 2024-07-08 NOTE — Assessment & Plan Note (Signed)
 Begin taking Wegovy  0.25 injection. Plan to follow-up in 3 months after starting medication.

## 2024-07-08 NOTE — Assessment & Plan Note (Signed)
 Continue working on diet and exercise. Begin taking Wegovy  0.25 mg injection.

## 2024-07-08 NOTE — Progress Notes (Signed)
 BP 136/82   Pulse 73   Resp 16   Ht 5' 5 (1.651 m)   Wt 227 lb 9.6 oz (103.2 kg)   LMP  (LMP Unknown)   SpO2 100%   BMI 37.87 kg/m    Subjective:    Patient ID: Kristine Mcconnell, female    DOB: [birth year], 58 y.o.   MRN: [id number]  HPI: Kristine Mcconnell is a 58 y.o. female with a history of hypertnesion, hypothyroidism, class II obesity, Hyperlipidemia, and prediabetes presenting today for a three month follow-up for weight management. Her weight today is 227 lbs with a BMI of 37.87. She has not started taking the Wegovy  0.25 mg injection but plans to start in the next week. Her diet consists of protein, vegetables, and fruits. She has been going to the gym and doing strength and cardio training 3 times a week.     - Encourage continuation of lifestyle modifications, including dietary management and regular exercise. -continue to increase physical activity, getting at least 150 min of physical activity a week.  Work on including Runner, broadcasting/film/video 2 days a week.  - continue eating at a calorie deficit 1600-1700 cal a day, eating a well balanced diet with whole foods, avoiding processed foods.   Patient is motivated to continue working on lifestyle modification.     Wt Readings from Last 3 Encounters:  07/08/24 227 lb 9.6 oz (103.2 kg)  04/06/24 225 lb (102.1 kg)  02/21/23 223 lb 14.4 oz (101.6 kg)   Body mass index is 37.87 kg/m.  Flowsheet Row Office Visit from 04/06/2024 in [facility]  1 42 inches       04/06/2024    8:36 AM 02/21/2023   10:06 AM 01/07/2023    8:24 AM  Depression screen PHQ 2/9  Decreased Interest 0 0 0  Down, Depressed, Hopeless 0 0 0  PHQ - 2 Score 0 0 0  Altered sleeping 0  0  Tired, decreased energy 0  0  Change in appetite 0  0  Feeling bad or failure about yourself  0  0  Trouble concentrating 0  0  Moving slowly or fidgety/restless 0  0  Suicidal thoughts 0  0  PHQ-9 Score 0  0  Difficult doing work/chores Not difficult  at all  Not difficult at all    Relevant past medical, surgical, family and social history reviewed and updated as indicated. Interim medical history since our last visit reviewed. Allergies and medications reviewed and updated.  Review of Systems  Ten systems reviewed and is negative except as mentioned in HPI      Objective:     BP 136/82   Pulse 73   Resp 16   Ht 5' 5 (1.651 m)   Wt 227 lb 9.6 oz (103.2 kg)   LMP  (LMP Unknown)   SpO2 100%   BMI 37.87 kg/m    Wt Readings from Last 3 Encounters:  07/08/24 227 lb 9.6 oz (103.2 kg)  04/06/24 225 lb (102.1 kg)  02/21/23 223 lb 14.4 oz (101.6 kg)    Physical Exam Constitutional:      Appearance: Normal appearance.  HENT:     Head: Normocephalic and atraumatic.  Cardiovascular:     Rate and Rhythm: Normal rate and regular rhythm.     Pulses: Normal pulses.     Heart sounds: Normal heart sounds.  Pulmonary:     Effort: Pulmonary effort is normal.  Breath sounds: Normal breath sounds.  Musculoskeletal:        General: Normal range of motion.     Cervical back: Normal range of motion and neck supple.  Skin:    General: Skin is warm and dry.  Neurological:     General: No focal deficit present.     Mental Status: She is alert and oriented to person, place, and time.  Psychiatric:        Mood and Affect: Mood normal.        Behavior: Behavior normal.        Thought Content: Thought content normal.        Judgment: Judgment normal.   Body mass index is 37.87 kg/m.     Results for orders placed or performed in visit on 04/06/24  CBC with Differential/Platelet   Collection Time: 04/06/24  9:29 AM  Result Value Ref Range   WBC 4.4 3.4 - 10.8 x10E3/uL   RBC 4.53 3.77 - 5.28 x10E6/uL   Hemoglobin 11.7 11.1 - 15.9 g/dL   Hematocrit 61.7 65.9 - 46.6 %   MCV 84 79 - 97 fL   MCH 25.8 (L) 26.6 - 33.0 pg   MCHC 30.6 (L) 31.5 - 35.7 g/dL   RDW 86.0 88.2 - 84.5 %   Platelets 312 150 - 450 x10E3/uL   Neutrophils 42  Not Estab. %   Lymphs 41 Not Estab. %   Monocytes 11 Not Estab. %   Eos 4 Not Estab. %   Basos 1 Not Estab. %   Neutrophils Absolute 1.9 1.4 - 7.0 x10E3/uL   Lymphocytes Absolute 1.8 0.7 - 3.1 x10E3/uL   Monocytes Absolute 0.5 0.1 - 0.9 x10E3/uL   EOS (ABSOLUTE) 0.2 0.0 - 0.4 x10E3/uL   Basophils Absolute 0.0 0.0 - 0.2 x10E3/uL   Immature Granulocytes 0 Not Estab. %   Immature Grans (Abs) 0.0 0.0 - 0.1 x10E3/uL  Comprehensive metabolic panel with GFR   Collection Time: 04/06/24  9:29 AM  Result Value Ref Range   Glucose 95 70 - 99 mg/dL   BUN 8 6 - 24 mg/dL   Creatinine, Ser 9.12 0.57 - 1.00 mg/dL   eGFR 79 >40 fO/fpw/8.26   BUN/Creatinine Ratio 9 9 - 23   Sodium 141 134 - 144 mmol/L   Potassium 4.4 3.5 - 5.2 mmol/L   Chloride 104 96 - 106 mmol/L   CO2 23 20 - 29 mmol/L   Calcium 9.3 8.7 - 10.2 mg/dL   Total Protein 6.6 6.0 - 8.5 g/dL   Albumin 4.2 3.8 - 4.9 g/dL   Globulin, Total 2.4 1.5 - 4.5 g/dL   Bilirubin Total 0.8 0.0 - 1.2 mg/dL   Alkaline Phosphatase 112 44 - 121 IU/L   AST 21 0 - 40 IU/L   ALT 17 0 - 32 IU/L  Cortisol   Collection Time: 04/06/24  9:29 AM  Result Value Ref Range   Cortisol 9.7 6.2 - 19.4 ug/dL  Estrogens , total   Collection Time: 04/06/24  9:29 AM  Result Value Ref Range   Estrogen 155 40 - 244 pg/mL  FSH/LH   Collection Time: 04/06/24  9:29 AM  Result Value Ref Range   LH 27.3 mIU/mL   FSH 36.0 mIU/mL  Hemoglobin A1c   Collection Time: 04/06/24  9:29 AM  Result Value Ref Range   Hgb A1c MFr Bld 6.0 (H) 4.8 - 5.6 %   Est. average glucose Bld gHb Est-mCnc 126 mg/dL  Lipid panel  Collection Time: 04/06/24  9:29 AM  Result Value Ref Range   Cholesterol, Total 154 100 - 199 mg/dL   Triglycerides 77 0 - 149 mg/dL   HDL 41 >60 mg/dL   VLDL Cholesterol Cal 15 5 - 40 mg/dL   LDL Chol Calc (NIH) 98 0 - 99 mg/dL   Chol/HDL Ratio 3.8 0.0 - 4.4 ratio  Prolactin   Collection Time: 04/06/24  9:29 AM  Result Value Ref Range   Prolactin 7.2 3.6  - 25.2 ng/mL  T3, free   Collection Time: 04/06/24  9:29 AM  Result Value Ref Range   T3, Free 3.6 2.0 - 4.4 pg/mL  T4, free   Collection Time: 04/06/24  9:29 AM  Result Value Ref Range   Free T4 1.21 0.82 - 1.77 ng/dL  TSH   Collection Time: 04/06/24  9:29 AM  Result Value Ref Range   TSH 3.480 0.450 - 4.500 uIU/mL  Vitamin B12   Collection Time: 04/06/24  9:29 AM  Result Value Ref Range   Vitamin B-12 738 232 - 1,245 pg/mL  VITAMIN D  25 Hydroxy (Vit-D Deficiency, Fractures)   Collection Time: 04/06/24  9:29 AM  Result Value Ref Range   Vit D, 25-Hydroxy 29.2 (L) 30.0 - 100.0 ng/mL          Assessment & Plan:   Problem List Items Addressed This Visit       Other   Class 2 severe obesity due to excess calories with serious comorbidity and body mass index (BMI) of 38.0 to 38.9 in adult - Primary   Begin taking Wegovy  0.25 injection. Plan to follow-up in 3 months after starting medication.       Hyperlipidemia   Continue working on diet and exercise. Begin taking Wegovy  0.25 mg injection.         Continue working on lifestyle modifications, including diet and exercise. Advised to increase protein in diet. Recommended 20-30 grams per meal. Plan to see patient in 3 months after starting Wegovy .     - Encourage continuation of lifestyle modifications, including dietary management and regular exercise. -continue to increase physical activity, getting at least 150 min of physical activity a week.  Work on including Runner, broadcasting/film/video 2 days a week.  - continue eating at a calorie deficit 1600-1700 cal a day, eating a well balanced diet with whole foods, avoiding processed foods.   Patient is motivated to continue working on lifestyle modification.      Follow up plan: Return in about 3 months (around 10/08/2024) for after starting wegovy .    I have reviewed this encounter including the documentation in this note and/or discussed this patient with the provider, Aislinn  Womack, SNP, I am certifying that I agree with the content of this note as supervising/preceptor nurse practitioner.  Mliss Spray, FNP-C [facility] 07/08/2024, 3:57 PM

## 2024-10-09 ENCOUNTER — Other Ambulatory Visit: Payer: Self-pay | Admitting: Nurse Practitioner

## 2024-10-09 DIAGNOSIS — I10 Essential (primary) hypertension: Secondary | ICD-10-CM

## 2024-10-11 NOTE — Telephone Encounter (Signed)
 Requested Prescriptions  Pending Prescriptions Disp Refills   amLODipine  (NORVASC ) 5 MG tablet [Pharmacy Med Name: AMLODIPINE  BESYLATE 5MG  TABLETS] 90 tablet 0    Sig: TAKE 1 TABLET(5 MG) BY MOUTH DAILY     Cardiovascular: Calcium Channel Blockers 2 Passed - 10/11/2024  9:44 AM      Passed - Last BP in normal range    BP Readings from Last 1 Encounters:  07/08/24 136/82         Passed - Last Heart Rate in normal range    Pulse Readings from Last 1 Encounters:  07/08/24 73         Passed - Valid encounter within last 6 months    Recent Outpatient Visits           3 months ago Class 2 severe obesity due to excess calories with serious comorbidity and body mass index (BMI) of 38.0 to 38.9 in adult   [facility] Gareth Mliss [place], FNP   6 months ago Essential hypertension   [facility] Gareth Mliss FAL[place], [place]
